# Patient Record
Sex: Male | Born: 1950 | Hispanic: Yes | Marital: Married | Smoking: Never smoker
Health system: Southern US, Community
[De-identification: ages and names within clinical notes are randomized; demographics above are authoritative.]

---

## 2019-06-18 ENCOUNTER — Inpatient Hospital Stay
Admission: EM | Admit: 2019-06-18 | Discharge: 2019-06-19 | DRG: 177 | Disposition: A | Discharge status: Other Acute Inpatient Hospital | Payer: Medicaid - Out of State | Attending: Internal Medicine | Admitting: Internal Medicine

## 2019-06-18 ENCOUNTER — Encounter: Payer: Self-pay | Admitting: Emergency Medicine

## 2019-06-18 ENCOUNTER — Emergency Department: Payer: Medicaid - Out of State

## 2019-06-18 ENCOUNTER — Other Ambulatory Visit: Payer: Self-pay

## 2019-06-18 DIAGNOSIS — U071 COVID-19: Secondary | ICD-10-CM | POA: Diagnosis not present

## 2019-06-18 DIAGNOSIS — J1289 Other viral pneumonia: Secondary | ICD-10-CM | POA: Diagnosis present

## 2019-06-18 DIAGNOSIS — M7989 Other specified soft tissue disorders: Secondary | ICD-10-CM | POA: Diagnosis present

## 2019-06-18 DIAGNOSIS — J988 Other specified respiratory disorders: Secondary | ICD-10-CM | POA: Diagnosis present

## 2019-06-18 DIAGNOSIS — E876 Hypokalemia: Secondary | ICD-10-CM | POA: Diagnosis present

## 2019-06-18 DIAGNOSIS — J9601 Acute respiratory failure with hypoxia: Secondary | ICD-10-CM | POA: Diagnosis present

## 2019-06-18 LAB — CBC WITH DIFFERENTIAL/PLATELET
Abs Immature Granulocytes: 0.04 10*3/uL (ref 0.00–0.07)
Basophils Absolute: 0 10*3/uL (ref 0.0–0.1)
Basophils Relative: 0 %
Eosinophils Absolute: 0 10*3/uL (ref 0.0–0.5)
Eosinophils Relative: 0 %
HCT: 39.1 % (ref 39.0–52.0)
Hemoglobin: 14.3 g/dL (ref 13.0–17.0)
Immature Granulocytes: 0 %
Lymphocytes Relative: 8 %
Lymphs Abs: 0.8 10*3/uL (ref 0.7–4.0)
MCH: 31.3 pg (ref 26.0–34.0)
MCHC: 36.6 g/dL — ABNORMAL HIGH (ref 30.0–36.0)
MCV: 85.6 fL (ref 80.0–100.0)
Monocytes Absolute: 0.8 10*3/uL (ref 0.1–1.0)
Monocytes Relative: 8 %
Neutro Abs: 8.2 10*3/uL — ABNORMAL HIGH (ref 1.7–7.7)
Neutrophils Relative %: 84 %
Platelets: 304 10*3/uL (ref 150–400)
RBC: 4.57 MIL/uL (ref 4.22–5.81)
RDW: 13.3 % (ref 11.5–15.5)
WBC: 9.8 10*3/uL (ref 4.0–10.5)
nRBC: 0 % (ref 0.0–0.2)

## 2019-06-18 LAB — COMPREHENSIVE METABOLIC PANEL
ALT: 31 U/L (ref 0–44)
AST: 50 U/L — ABNORMAL HIGH (ref 15–41)
Albumin: 3.8 g/dL (ref 3.5–5.0)
Alkaline Phosphatase: 78 U/L (ref 38–126)
Anion gap: 16 — ABNORMAL HIGH (ref 5–15)
BUN: 19 mg/dL (ref 8–23)
CO2: 19 mmol/L — ABNORMAL LOW (ref 22–32)
Calcium: 8.6 mg/dL — ABNORMAL LOW (ref 8.9–10.3)
Chloride: 96 mmol/L — ABNORMAL LOW (ref 98–111)
Creatinine, Ser: 0.68 mg/dL (ref 0.61–1.24)
GFR calc Af Amer: >60 mL/min (ref >60)
GFR calc non Af Amer: >60 mL/min (ref >60)
Glucose, Bld: 136 mg/dL — ABNORMAL HIGH (ref 70–99)
Potassium: 2.9 mmol/L — ABNORMAL LOW (ref 3.5–5.1)
Sodium: 131 mmol/L — ABNORMAL LOW (ref 135–145)
Total Bilirubin: 1.3 mg/dL — ABNORMAL HIGH (ref 0.3–1.2)
Total Protein: 7.8 g/dL (ref 6.5–8.1)

## 2019-06-18 LAB — LACTATE DEHYDROGENASE: LDH: 277 U/L — ABNORMAL HIGH (ref 98–192)

## 2019-06-18 LAB — POC SARS CORONAVIRUS 2 AG -  ED: SARS Coronavirus 2 Ag: POSITIVE — AB

## 2019-06-18 LAB — PROCALCITONIN: Procalcitonin: <0.1 ng/mL

## 2019-06-18 LAB — LACTIC ACID, PLASMA
Lactic Acid, Venous: 1.7 mmol/L (ref 0.5–1.9)
Lactic Acid, Venous: 1.4 mmol/L (ref 0.5–1.9)

## 2019-06-18 LAB — CBC
HCT: 38.8 % — ABNORMAL LOW (ref 39.0–52.0)
Hemoglobin: 13.6 g/dL (ref 13.0–17.0)
MCH: 31.3 pg (ref 26.0–34.0)
MCHC: 35.1 g/dL (ref 30.0–36.0)
MCV: 89.2 fL (ref 80.0–100.0)
Platelets: 285 10*3/uL (ref 150–400)
RBC: 4.35 MIL/uL (ref 4.22–5.81)
RDW: 13.2 % (ref 11.5–15.5)
WBC: 9.5 10*3/uL (ref 4.0–10.5)
nRBC: 0 % (ref 0.0–0.2)

## 2019-06-18 LAB — FIBRINOGEN: Fibrinogen: >750 mg/dL — ABNORMAL HIGH (ref 210–475)

## 2019-06-18 LAB — FIBRIN DERIVATIVES D-DIMER (ARMC ONLY): Fibrin derivatives D-dimer (ARMC): 1076.59 ng/mL (FEU) — ABNORMAL HIGH (ref 0.00–499.00)

## 2019-06-18 LAB — TRIGLYCERIDES: Triglycerides: 61 mg/dL (ref <150)

## 2019-06-18 LAB — BRAIN NATRIURETIC PEPTIDE: B Natriuretic Peptide: 30 pg/mL (ref 0.0–100.0)

## 2019-06-18 LAB — TROPONIN I (HIGH SENSITIVITY): Troponin I (High Sensitivity): 9 ng/L (ref <18)

## 2019-06-18 LAB — FERRITIN: Ferritin: 772 ng/mL — ABNORMAL HIGH (ref 24–336)

## 2019-06-18 MED ORDER — DEXAMETHASONE SODIUM PHOSPHATE 10 MG/ML IJ SOLN
6.0000 mg | Freq: Once | INTRAMUSCULAR | Status: AC
  Administered 2019-06-18: 6 mg via INTRAVENOUS
  Filled 2019-06-18: qty 1

## 2019-06-18 MED ORDER — METHYLPREDNISOLONE SODIUM SUCC 125 MG IJ SOLR
0.5000 mg/kg | Freq: Two times a day (BID) | INTRAMUSCULAR | Status: DC
  Administered 2019-06-18: 50 mg via INTRAVENOUS
  Filled 2019-06-18: qty 2

## 2019-06-18 MED ORDER — SODIUM CHLORIDE 0.9% FLUSH
3.0000 mL | Freq: Two times a day (BID) | INTRAVENOUS | Status: DC
  Administered 2019-06-18: 3 mL via INTRAVENOUS

## 2019-06-18 MED ORDER — SODIUM CHLORIDE 0.9 % IV BOLUS
500.0000 mL | Freq: Once | INTRAVENOUS | Status: AC
  Administered 2019-06-18: 500 mL via INTRAVENOUS

## 2019-06-18 MED ORDER — ACETAMINOPHEN 500 MG PO TABS
1000.0000 mg | ORAL_TABLET | Freq: Once | ORAL | Status: AC
  Administered 2019-06-18: 1000 mg via ORAL
  Filled 2019-06-18: qty 2

## 2019-06-18 MED ORDER — ADULT MULTIVITAMIN W/MINERALS CH: 1.0000 | ORAL_TABLET | Freq: Every day | ORAL | Status: DC

## 2019-06-18 MED ORDER — SODIUM CHLORIDE 0.9% FLUSH: 3.0000 mL | INTRAVENOUS | Status: DC | PRN

## 2019-06-18 MED ORDER — ENOXAPARIN SODIUM 40 MG/0.4ML ~~LOC~~ SOLN
40.0000 mg | SUBCUTANEOUS | Status: DC
  Administered 2019-06-18: 40 mg via SUBCUTANEOUS
  Filled 2019-06-18: qty 0.4

## 2019-06-18 MED ORDER — SODIUM CHLORIDE 0.9 % IV SOLN
200.0000 mg | Freq: Once | INTRAVENOUS | Status: AC
  Administered 2019-06-19: 200 mg via INTRAVENOUS
  Filled 2019-06-18: qty 200

## 2019-06-18 MED ORDER — SODIUM CHLORIDE 0.9 % IV SOLN
100.0000 mg | Freq: Every day | INTRAVENOUS | Status: DC
  Filled 2019-06-18: qty 20

## 2019-06-18 MED ORDER — POTASSIUM CHLORIDE 10 MEQ/100ML IV SOLN
10.0000 meq | INTRAVENOUS | Status: AC
  Administered 2019-06-18 – 2019-06-19 (×3): 10 meq via INTRAVENOUS
  Filled 2019-06-18 (×3): qty 100

## 2019-06-18 MED ORDER — ASCORBIC ACID 500 MG PO TABS
500.0000 mg | ORAL_TABLET | Freq: Every day | ORAL | Status: DC
  Filled 2019-06-18: qty 1

## 2019-06-18 MED ORDER — SODIUM CHLORIDE 0.9 % IV SOLN: 250.0000 mL | INTRAVENOUS | Status: DC | PRN

## 2019-06-18 MED ORDER — ALBUTEROL SULFATE HFA 108 (90 BASE) MCG/ACT IN AERS
2.0000 | INHALATION_SPRAY | Freq: Four times a day (QID) | RESPIRATORY_TRACT | Status: DC
  Administered 2019-06-18: 2 via RESPIRATORY_TRACT
  Filled 2019-06-18: qty 6.7

## 2019-06-18 MED ORDER — GUAIFENESIN-DM 100-10 MG/5ML PO SYRP
10.0000 mL | ORAL_SOLUTION | ORAL | Status: DC | PRN
  Filled 2019-06-18: qty 10

## 2019-06-18 MED ORDER — ZINC SULFATE 220 (50 ZN) MG PO CAPS
220.0000 mg | ORAL_CAPSULE | Freq: Every day | ORAL | Status: DC
  Filled 2019-06-18: qty 1

## 2019-06-18 NOTE — ED Notes (Signed)
X-ray at bedside

## 2019-06-18 NOTE — ED Triage Notes (Signed)
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 Patient has felt short of breath for apprx 5 days. 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 Reports no fever or chills, but feels as though he cannot catch his breath. Reports slight leg pain

## 2019-06-18 NOTE — H&P (Addendum)
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  Shortness of breath at rest which worsens with exertion.  Also reports right lower extremity swelling.  Arrival in the emergency room he was on O2 requiring 6 L of oxygen to maintain sats of 92%.  Temperature was 100.5 heart rate 114 and respirations 26.  Blood pressure normal at 125/79.  On his blood work he was hypokalemic with a potassium of 2.9.  Had elevations in several inflammatory biomarkers.  Chest x-ray showed no acute findings.  Covid test was positive.  Due to right lower extremity swelling a lower extremity Doppler was ordered and results are pending at the time of decision to admit.  She was started on IV Decadron and remdesivir from the emergency room.  Review of Systems: As per HPI otherwise 10 point review of systems negative.    History reviewed. No pertinent past medical history.  History reviewed. No pertinent surgical history.   reports that he has never smoked. He has never used smokeless tobacco. He reports previous alcohol use. He reports that he does not use drugs.  Not on File  History reviewed. No pertinent family history.  Prior to Admission medications   Not on File    Physical Exam: Vitals:   06/18/19 2107 06/18/19 2130 06/18/19 2200 06/18/19 2230  BP: 125/79 127/86 113/71 131/80  Pulse: (!) 114 (!) 110 (!) 104 78  Resp: (!) 22 (!) 24 (!) 29 (!) 25  Temp:      SpO2: 97% 99% 98% 95%  Weight:      Height:        Constitutional: NAD, calm, comfortable Vitals:   06/18/19 2107 06/18/19 2130 06/18/19 2200 06/18/19 2230  BP: 125/79 127/86 113/71 131/80  Pulse: (!) 114 (!) 110 (!) 104 78  Resp:  (!) 22 (!) 24 (!) 29 (!) 25  Temp:      SpO2: 97% 99% 98% 95%  Weight:      Height:       Constitutional: patient in no distress and with conversational dyspnea Eyes: PERRL, lids and conjunctivae normal ENMT: Mucous membranes are moist. Posterior pharynx clear of any exudate or lesions.Normal dentition.  Neck: normal, supple, no masses, no thyromegaly Respiratory: coarse breath sounds, tachypnea, increased accessory muscle use.  Cardiovascular: Tachycardia no murmurs / rubs / gallops. Mile RLE swelling pedal pulses. No carotid bruits.  Abdomen: no tenderness, no masses palpated. No hepatosplenomegaly. Bowel sounds positive.  Musculoskeletal: mild swelling RLESkin: no rashes, lesions, ulcers. No induration Neurologic: CN 2-12 grossly intact.  Psychiatric: A&O x3  Labs on Admission: I have personally reviewed following labs and imaging studies  CBC: Recent Labs  Lab 06/18/19 2105  WBC 9.8  NEUTROABS 8.2*  HGB 14.3  HCT 39.1  MCV 85.6  PLT 304   Basic Metabolic Panel: Recent Labs  Lab 06/18/19 2105  NA 131*  K 2.9*  CL 96*  CO2 19*  GLUCOSE 136*  BUN 19  CREATININE 0.68  CALCIUM 8.6*   GFR: Estimated Creatinine Clearance: 103.2 mL/min (by C-G formula based on SCr of 0.68 mg/dL). Liver Function Tests: Recent Labs  Lab 06/18/19 2105  AST 50*  ALT 31  ALKPHOS  78  BILITOT 1.3*  PROT 7.8  ALBUMIN 3.8   No results for input(s): LIPASE, AMYLASE in the last 168 hours. No results for input(s): AMMONIA in the last 168 hours. Coagulation Profile: No results for input(s): INR, PROTIME in the last 168 hours. Cardiac Enzymes: No results for input(s): CKTOTAL, CKMB, CKMBINDEX, TROPONINI in the last 168 hours. BNP (last 3 results) No results for input(s): PROBNP in the last 8760 hours. HbA1C: No results for input(s): HGBA1C in the last 72 hours. CBG: No results for input(s): GLUCAP in the last 168 hours. Lipid Profile: Recent Labs    06/18/19 1205  TRIG 61    Thyroid Function Tests: No results for input(s): TSH, T4TOTAL, FREET4, T3FREE, THYROIDAB in the last 72 hours. Anemia Panel: Recent Labs    06/18/19 2105  FERRITIN 772*   Urine analysis: No results found for: COLORURINE, APPEARANCEUR, LABSPEC, PHURINE, GLUCOSEU, HGBUR, BILIRUBINUR, KETONESUR, PROTEINUR, UROBILINOGEN, NITRITE, LEUKOCYTESUR  Radiological Exams on Admission: DG Chest Port 1 View  Result Date: 06/18/2019 CLINICAL DATA:  Shortness of breath EXAM: PORTABLE CHEST 1 VIEW COMPARISON:  None. FINDINGS: Low lung volumes with bibasilar atelectasis. Cardiomegaly. No overt edema or effusions. No acute bony abnormality. IMPRESSION: Low lung volumes, bibasilar atelectasis. Electronically Signed   By: Rolm Baptise M.D.   On: 06/18/2019 21:37      Assessment/Plan    Acute respiratory failure with hypoxia (HCC) secondary to COVID-19 virus infection --Patient requiring 6 L O2 to maintain sats in the low 90s --Continue supplemental oxygen to keep sats over 92% --Proning protocol as tolerated --IV remdesivir per protocol.  Vitamins --IV Solu-Medrol 0.5 mg/kg per orders    Right leg swelling --Follow-up right lower extremity Dopplers ordered by the emergency room --RLE doppler negative for DVT  Hypokalemia --Supplement and monitor   Code Status: full    Athena Masse MD [facility]   If 7PM-7AM, please contact night-coverage [email or web address] Password [email or web address]  06/18/2019, 10:41 PM  \

## 2019-06-18 NOTE — ED Provider Notes (Signed)
[facility] Emergency Department Provider Note  ____________________________________________   First MD Initiated Contact with Patient 06/18/19 2110     (approximate)  I have reviewed the triage vital signs and the nursing notes.   HISTORY  Chief Complaint Shortness of Breath    HPI William Beard is a 68 y.o. male who comes in with shortness of breath.  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  Shortness of breath is severe, constant, nothing makes it better, worse with exertion.  He also reports that he is having a little bit of swelling in his right leg.  Denies history of blood clots.  Denies any chest pain.  Denies any abdominal pain.  No known coronavirus contacts.  History reviewed. No pertinent past medical history.  There are no problems to display for this patient.   History reviewed. No pertinent surgical history.  Prior to Admission medications   Not on File    Allergies Patient has no allergy information on record.  History reviewed. No pertinent family history.  Social History Social History   Tobacco Use  . Smoking status: Never Smoker  . Smokeless tobacco: Never Used  Substance Use Topics  . Alcohol use: Not Currently  . Drug use: Never      Review of Systems Constitutional: No fever/chills Eyes: No visual changes. ENT: No sore throat. Cardiovascular: No chest pain Respiratory: Positive for SOB Gastrointestinal: No abdominal pain.  No nausea, no vomiting.  No diarrhea.  No constipation. Genitourinary: Negative for dysuria. Musculoskeletal: Negative for back pain.  Leg swelling Skin: Negative for rash. Neurological: Negative for headaches, focal weakness or numbness. All other ROS negative ____________________________________________   PHYSICAL EXAM:  VITAL SIGNS: ED Triage Vitals  Enc Vitals Group     BP --      Pulse Rate 06/18/19 2103 (!) 114     Resp  06/18/19 2103 (!) 26     Temp 06/18/19 2103 (!) 100.5 F (38.1 C)     Temp src --      SpO2 06/18/19 2103 98 %     Weight 06/18/19 2104 220 lb (99.8 kg)     Height 06/18/19 2104 5\' 10"  (1.778 m)     Head Circumference --      Peak Flow --      Pain Score 06/18/19 2103 2     Pain Loc --      Pain Edu? --      Excl. in [place]? --     Constitutional: Alert and oriented. Well appearing and in no acute distress. Eyes: Conjunctivae are normal. EOMI. Head: Atraumatic. Nose: No congestion/rhinnorhea. Mouth/Throat: Mucous membranes are moist.   Neck: No stridor. Trachea Midline. FROM Cardiovascular: Tachycardic, regular rhythm. Grossly normal heart sounds.  Good peripheral circulation. Respiratory: Increased work of breathing on 6 L no stridor Gastrointestinal: Soft and nontender. No distention. No abdominal bruits.  Musculoskeletal: May be slightly enlarged right leg.  Left leg.  No joint effusions. Neurologic:  Normal speech and language. No gross focal neurologic deficits are appreciated.  Skin:  Skin is warm, dry and intact. No rash noted. Psychiatric: Mood and affect are normal. Speech and behavior are normal. GU: Deferred   ____________________________________________   LABS (all labs ordered are listed, but only abnormal results are displayed)  Labs Reviewed  CBC WITH DIFFERENTIAL/PLATELET - Abnormal; Notable for the following components:      Result Value   MCHC  36.6 (*)    Neutro Abs 8.2 (*)    All other components within normal limits  COMPREHENSIVE METABOLIC PANEL - Abnormal; Notable for the following components:   Sodium 131 (*)    Potassium 2.9 (*)    Chloride 96 (*)    CO2 19 (*)    Glucose, Bld 136 (*)    Calcium 8.6 (*)    AST 50 (*)    Total Bilirubin 1.3 (*)    Anion gap 16 (*)    All other components within normal limits  FIBRIN DERIVATIVES D-DIMER ([facility] ONLY) - Abnormal; Notable for the following components:   Fibrin derivatives D-dimer ([facility]) 1,076.59 (*)     All other components within normal limits  LACTATE DEHYDROGENASE - Abnormal; Notable for the following components:   LDH 277 (*)    All other components within normal limits  FERRITIN - Abnormal; Notable for the following components:   Ferritin 772 (*)    All other components within normal limits  FIBRINOGEN - Abnormal; Notable for the following components:   Fibrinogen >750 (*)    All other components within normal limits  POC SARS CORONAVIRUS 2 AG -  ED - Abnormal; Notable for the following components:   SARS Coronavirus 2 Ag Positive (*)    All other components within normal limits  CULTURE, BLOOD (ROUTINE X 2)  CULTURE, BLOOD (ROUTINE X 2)  LACTIC ACID, PLASMA  BRAIN NATRIURETIC PEPTIDE  LACTIC ACID, PLASMA  PROCALCITONIN  TRIGLYCERIDES  C-REACTIVE PROTEIN  MAGNESIUM  TROPONIN I (HIGH SENSITIVITY)   ____________________________________________   ED ECG REPORT I, Concha Se, the attending physician, personally viewed and interpreted this ECG.  EKG is sinus rate of 90, no ST elevations, no T wave inversions, normal intervals ____________________________________________  RADIOLOGY Vela Prose, personally viewed and evaluated these images (plain radiographs) as part of my medical decision making, as well as reviewing the written report by the radiologist.  ED MD interpretation:  No PNA  Official radiology report(s): DG Chest Port 1 View  Result Date: 06/18/2019 CLINICAL DATA:  Shortness of breath EXAM: PORTABLE CHEST 1 VIEW COMPARISON:  None. FINDINGS: Low lung volumes with bibasilar atelectasis. Cardiomegaly. No overt edema or effusions. No acute bony abnormality. IMPRESSION: Low lung volumes, bibasilar atelectasis. Electronically Signed   By: Charlett Nose M.D.   On: 06/18/2019 21:37    ____________________________________________   PROCEDURES  Procedure(s) performed (including Critical Care):  .Critical Care Performed by: Concha Se, MD Authorized  by: Concha Se, MD   Critical care provider statement:    Critical care time (minutes):  35   Critical care was necessary to treat or prevent imminent or life-threatening deterioration of the following conditions:  Respiratory failure   Critical care was time spent personally by me on the following activities:  Discussions with consultants, evaluation of patient's response to treatment, examination of patient, ordering and performing treatments and interventions, ordering and review of laboratory studies, ordering and review of radiographic studies, pulse oximetry, re-evaluation of patient's condition, obtaining history from patient or surrogate and review of old charts     ____________________________________________   INITIAL IMPRESSION / ASSESSMENT AND PLAN / ED COURSE   Zaydin Billey was evaluated in Emergency Department on 06/18/2019 for the symptoms described in the history of present illness. He was evaluated in the context of the global COVID-19 pandemic, which necessitated consideration that the patient might be at risk for infection with the SARS-CoV-2 virus that causes COVID-19.  Institutional protocols and algorithms that pertain to the evaluation of patients at risk for COVID-19 are in a state of rapid change based on information released by regulatory bodies including the CDC and federal and state organizations. These policies and algorithms were followed during the patient's care in the ED.     Pt presents with SOB. Most likely from COVID+ but no prior diagnosis. Will get testing.   PNA-will get xray to evaluation Anemia-CBC to evaluate ACS- will get trops Arrhythmia-Will get EKG and keep on monitor.  COVID- will get testing per algorithm. 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    Pt is COVID +  Patient's potassium slightly low so we will give some K back.  Also given some fluids for some signs of will dehydration.  Will discuss  with the hospital team for admission.  We will give steroids, Tylenol.  Holding off on antibiotics at this time given white count is normal pending procalcitonin   ____________________________________________   FINAL CLINICAL IMPRESSION(S) / ED DIAGNOSES   Final diagnoses:  COVID-19  Acute respiratory failure with hypoxia (HCC)  Hypokalemia     MEDICATIONS GIVEN DURING THIS VISIT:  Medications  dexamethasone (DECADRON) injection 6 mg (has no administration in time range)  sodium chloride 0.9 % bolus 500 mL (has no administration in time range)  potassium chloride 10 mEq in 100 mL IVPB (has no administration in time range)  acetaminophen (TYLENOL) tablet 1,000 mg (1,000 mg Oral Given 06/18/19 2118)     ED Discharge Orders    None       Note:  This document was prepared using Dragon voice recognition software and may include unintentional dictation errors.   Concha SeFunke, Daanish Copes E, MD 06/18/19 2226

## 2019-06-18 NOTE — ED Notes (Signed)
Ultrasound at bedside

## 2019-06-19 ENCOUNTER — Inpatient Hospital Stay: Payer: Medicaid - Out of State

## 2019-06-19 ENCOUNTER — Inpatient Hospital Stay (HOSPITAL_COMMUNITY)
Admission: EM | Admit: 2019-06-19 | Discharge: 2019-07-06 | DRG: 177 | Disposition: A | Discharge status: Home / Self Care | Payer: Medicaid - Out of State | Source: Other Acute Inpatient Hospital | Attending: Family Medicine | Admitting: Family Medicine

## 2019-06-19 ENCOUNTER — Encounter (HOSPITAL_COMMUNITY): Payer: Self-pay | Admitting: Family Medicine

## 2019-06-19 DIAGNOSIS — E876 Hypokalemia: Secondary | ICD-10-CM | POA: Diagnosis not present

## 2019-06-19 DIAGNOSIS — J9601 Acute respiratory failure with hypoxia: Secondary | ICD-10-CM | POA: Diagnosis present

## 2019-06-19 DIAGNOSIS — J1282 Pneumonia due to coronavirus disease 2019: Secondary | ICD-10-CM | POA: Diagnosis present

## 2019-06-19 DIAGNOSIS — I4891 Unspecified atrial fibrillation: Secondary | ICD-10-CM | POA: Diagnosis present

## 2019-06-19 DIAGNOSIS — U071 COVID-19: Secondary | ICD-10-CM | POA: Diagnosis present

## 2019-06-19 DIAGNOSIS — I4892 Unspecified atrial flutter: Secondary | ICD-10-CM | POA: Diagnosis present

## 2019-06-19 DIAGNOSIS — R0602 Shortness of breath: Secondary | ICD-10-CM

## 2019-06-19 DIAGNOSIS — E669 Obesity, unspecified: Secondary | ICD-10-CM | POA: Diagnosis present

## 2019-06-19 DIAGNOSIS — Z6831 Body mass index (BMI) 31.0-31.9, adult: Secondary | ICD-10-CM | POA: Diagnosis not present

## 2019-06-19 LAB — COMPREHENSIVE METABOLIC PANEL
ALT: 29 U/L (ref 0–44)
AST: 47 U/L — ABNORMAL HIGH (ref 15–41)
Albumin: 3.5 g/dL (ref 3.5–5.0)
Alkaline Phosphatase: 73 U/L (ref 38–126)
Anion gap: 13 (ref 5–15)
BUN: 19 mg/dL (ref 8–23)
CO2: 24 mmol/L (ref 22–32)
Calcium: 8.3 mg/dL — ABNORMAL LOW (ref 8.9–10.3)
Chloride: 98 mmol/L (ref 98–111)
Creatinine, Ser: 0.77 mg/dL (ref 0.61–1.24)
GFR calc Af Amer: >60 mL/min (ref >60)
GFR calc non Af Amer: >60 mL/min (ref >60)
Glucose, Bld: 150 mg/dL — ABNORMAL HIGH (ref 70–99)
Potassium: 3.6 mmol/L (ref 3.5–5.1)
Sodium: 135 mmol/L (ref 135–145)
Total Bilirubin: 1.2 mg/dL (ref 0.3–1.2)
Total Protein: 7.4 g/dL (ref 6.5–8.1)

## 2019-06-19 LAB — HIV ANTIBODY (ROUTINE TESTING W REFLEX): HIV Screen 4th Generation wRfx: NONREACTIVE

## 2019-06-19 LAB — CBC WITH DIFFERENTIAL/PLATELET
Abs Immature Granulocytes: 0.03 10*3/uL (ref 0.00–0.07)
Basophils Absolute: 0 10*3/uL (ref 0.0–0.1)
Basophils Relative: 0 %
Eosinophils Absolute: 0 10*3/uL (ref 0.0–0.5)
Eosinophils Relative: 0 %
HCT: 40.6 % (ref 39.0–52.0)
Hemoglobin: 13.9 g/dL (ref 13.0–17.0)
Immature Granulocytes: 0 %
Lymphocytes Relative: 10 %
Lymphs Abs: 0.8 10*3/uL (ref 0.7–4.0)
MCH: 31 pg (ref 26.0–34.0)
MCHC: 34.2 g/dL (ref 30.0–36.0)
MCV: 90.6 fL (ref 80.0–100.0)
Monocytes Absolute: 0.3 10*3/uL (ref 0.1–1.0)
Monocytes Relative: 3 %
Neutro Abs: 6.8 10*3/uL (ref 1.7–7.7)
Neutrophils Relative %: 87 %
Platelets: 270 10*3/uL (ref 150–400)
RBC: 4.48 MIL/uL (ref 4.22–5.81)
RDW: 13.2 % (ref 11.5–15.5)
WBC: 7.9 10*3/uL (ref 4.0–10.5)
nRBC: 0 % (ref 0.0–0.2)

## 2019-06-19 LAB — ABO/RH
ABO/RH(D): O POS
ABO/RH(D): O POS

## 2019-06-19 LAB — PHOSPHORUS: Phosphorus: 3.4 mg/dL (ref 2.5–4.6)

## 2019-06-19 LAB — MAGNESIUM
Magnesium: 2.3 mg/dL (ref 1.7–2.4)
Magnesium: 2.6 mg/dL — ABNORMAL HIGH (ref 1.7–2.4)

## 2019-06-19 LAB — C-REACTIVE PROTEIN
CRP: 11.5 mg/dL — ABNORMAL HIGH (ref <1.0)
CRP: 11.9 mg/dL — ABNORMAL HIGH (ref <1.0)

## 2019-06-19 LAB — FERRITIN: Ferritin: 800 ng/mL — ABNORMAL HIGH (ref 24–336)

## 2019-06-19 LAB — FIBRIN DERIVATIVES D-DIMER (ARMC ONLY): Fibrin derivatives D-dimer (ARMC): 840.79 ng/mL (FEU) — ABNORMAL HIGH (ref 0.00–499.00)

## 2019-06-19 LAB — GLUCOSE, CAPILLARY
Glucose-Capillary: 133 mg/dL — ABNORMAL HIGH (ref 70–99)
Glucose-Capillary: 161 mg/dL — ABNORMAL HIGH (ref 70–99)
Glucose-Capillary: 208 mg/dL — ABNORMAL HIGH (ref 70–99)

## 2019-06-19 LAB — HEMOGLOBIN A1C
Hgb A1c MFr Bld: 6.4 % — ABNORMAL HIGH (ref 4.8–5.6)
Mean Plasma Glucose: 136.98 mg/dL

## 2019-06-19 LAB — TROPONIN I (HIGH SENSITIVITY): Troponin I (High Sensitivity): 12 ng/L (ref <18)

## 2019-06-19 MED ORDER — SODIUM CHLORIDE 0.9 % IV SOLN: 100.0000 mg | Freq: Every day | INTRAVENOUS | Status: DC

## 2019-06-19 MED ORDER — ONDANSETRON HCL 4 MG PO TABS: 4.0000 mg | ORAL_TABLET | Freq: Four times a day (QID) | ORAL | Status: DC | PRN

## 2019-06-19 MED ORDER — BISACODYL 5 MG PO TBEC
5.0000 mg | DELAYED_RELEASE_TABLET | Freq: Every day | ORAL | Status: DC | PRN
  Administered 2019-06-26 – 2019-07-02 (×4): 5 mg via ORAL
  Filled 2019-06-19 (×5): qty 1

## 2019-06-19 MED ORDER — ALBUTEROL SULFATE HFA 108 (90 BASE) MCG/ACT IN AERS
2.0000 | INHALATION_SPRAY | Freq: Four times a day (QID) | RESPIRATORY_TRACT | Status: DC | PRN
  Filled 2019-06-19: qty 6.7

## 2019-06-19 MED ORDER — SODIUM CHLORIDE 0.9 % IV SOLN
100.0000 mg | Freq: Every day | INTRAVENOUS | Status: AC
  Administered 2019-06-19 – 2019-06-22 (×4): 100 mg via INTRAVENOUS
  Filled 2019-06-19 (×3): qty 20

## 2019-06-19 MED ORDER — ONDANSETRON HCL 4 MG/2ML IJ SOLN: 4.0000 mg | Freq: Four times a day (QID) | INTRAMUSCULAR | Status: DC | PRN

## 2019-06-19 MED ORDER — ENOXAPARIN SODIUM 40 MG/0.4ML ~~LOC~~ SOLN: 40.0000 mg | Freq: Two times a day (BID) | SUBCUTANEOUS | Status: DC

## 2019-06-19 MED ORDER — INSULIN ASPART 100 UNIT/ML ~~LOC~~ SOLN
0.0000 [IU] | Freq: Three times a day (TID) | SUBCUTANEOUS | Status: DC
  Administered 2019-06-19: 1 [IU] via SUBCUTANEOUS
  Administered 2019-06-19 – 2019-06-20 (×2): 2 [IU] via SUBCUTANEOUS
  Administered 2019-06-20: 1 [IU] via SUBCUTANEOUS
  Administered 2019-06-20: 2 [IU] via SUBCUTANEOUS
  Administered 2019-06-21 (×2): 1 [IU] via SUBCUTANEOUS
  Administered 2019-06-21 – 2019-06-22 (×2): 2 [IU] via SUBCUTANEOUS
  Administered 2019-06-22: 3 [IU] via SUBCUTANEOUS
  Administered 2019-06-22: 5 [IU] via SUBCUTANEOUS
  Administered 2019-06-23 (×2): 3 [IU] via SUBCUTANEOUS
  Administered 2019-06-23 – 2019-06-24 (×3): 2 [IU] via SUBCUTANEOUS
  Administered 2019-06-24: 5 [IU] via SUBCUTANEOUS
  Administered 2019-06-25: 7 [IU] via SUBCUTANEOUS
  Administered 2019-06-25: 2 [IU] via SUBCUTANEOUS
  Administered 2019-06-25: 3 [IU] via SUBCUTANEOUS
  Administered 2019-06-26: 2 [IU] via SUBCUTANEOUS
  Administered 2019-06-26 (×2): 5 [IU] via SUBCUTANEOUS
  Administered 2019-06-27 (×2): 3 [IU] via SUBCUTANEOUS
  Administered 2019-06-27: 1 [IU] via SUBCUTANEOUS
  Administered 2019-06-28: 7 [IU] via SUBCUTANEOUS
  Administered 2019-06-28: 3 [IU] via SUBCUTANEOUS
  Administered 2019-06-28: 1 [IU] via SUBCUTANEOUS
  Administered 2019-06-29: 2 [IU] via SUBCUTANEOUS
  Administered 2019-06-29: 1 [IU] via SUBCUTANEOUS
  Administered 2019-06-29: 2 [IU] via SUBCUTANEOUS
  Administered 2019-06-30: 1 [IU] via SUBCUTANEOUS
  Administered 2019-06-30: 2 [IU] via SUBCUTANEOUS
  Administered 2019-06-30: 1 [IU] via SUBCUTANEOUS
  Administered 2019-07-01 (×2): 2 [IU] via SUBCUTANEOUS
  Administered 2019-07-02: 1 [IU] via SUBCUTANEOUS
  Administered 2019-07-02: 2 [IU] via SUBCUTANEOUS
  Administered 2019-07-03 (×2): 1 [IU] via SUBCUTANEOUS
  Administered 2019-07-03 – 2019-07-04 (×2): 2 [IU] via SUBCUTANEOUS
  Administered 2019-07-04 – 2019-07-05 (×2): 1 [IU] via SUBCUTANEOUS

## 2019-06-19 MED ORDER — ACETAMINOPHEN 325 MG PO TABS: 650.0000 mg | ORAL_TABLET | Freq: Four times a day (QID) | ORAL | Status: DC | PRN

## 2019-06-19 MED ORDER — ASPIRIN 81 MG PO CHEW
81.0000 mg | CHEWABLE_TABLET | Freq: Every day | ORAL | Status: DC
  Administered 2019-06-19 – 2019-07-06 (×18): 81 mg via ORAL
  Filled 2019-06-19 (×18): qty 1

## 2019-06-19 MED ORDER — IOHEXOL 350 MG/ML SOLN
75.0000 mL | Freq: Once | INTRAVENOUS | Status: AC | PRN
  Administered 2019-06-19: 05:00:00 75 mL via INTRAVENOUS

## 2019-06-19 MED ORDER — METHYLPREDNISOLONE SODIUM SUCC 125 MG IJ SOLR
60.0000 mg | Freq: Two times a day (BID) | INTRAMUSCULAR | Status: DC
  Administered 2019-06-19 – 2019-06-22 (×7): 60 mg via INTRAVENOUS
  Filled 2019-06-19 (×8): qty 2

## 2019-06-19 MED ORDER — INSULIN ASPART 100 UNIT/ML ~~LOC~~ SOLN
0.0000 [IU] | Freq: Every day | SUBCUTANEOUS | Status: DC
  Administered 2019-06-19 – 2019-06-26 (×3): 2 [IU] via SUBCUTANEOUS

## 2019-06-19 MED ORDER — GUAIFENESIN-DM 100-10 MG/5ML PO SYRP
10.0000 mL | ORAL_SOLUTION | ORAL | Status: DC | PRN
  Administered 2019-06-21 – 2019-06-26 (×4): 10 mL via ORAL
  Filled 2019-06-19 (×4): qty 10

## 2019-06-19 MED ORDER — SODIUM CHLORIDE 0.9 % IV SOLN: 200.0000 mg | Freq: Once | INTRAVENOUS | Status: DC

## 2019-06-19 MED ORDER — ENOXAPARIN SODIUM 40 MG/0.4ML ~~LOC~~ SOLN
40.0000 mg | SUBCUTANEOUS | Status: DC
  Administered 2019-06-19 – 2019-07-05 (×17): 40 mg via SUBCUTANEOUS
  Filled 2019-06-19 (×17): qty 0.4

## 2019-06-19 MED ORDER — HYDROCOD POLST-CPM POLST ER 10-8 MG/5ML PO SUER: 5.0000 mL | Freq: Two times a day (BID) | ORAL | Status: DC | PRN

## 2019-06-19 MED ORDER — METOPROLOL TARTRATE 25 MG PO TABS
25.0000 mg | ORAL_TABLET | Freq: Two times a day (BID) | ORAL | Status: DC
  Administered 2019-06-19 – 2019-07-03 (×24): 25 mg via ORAL
  Filled 2019-06-19 (×30): qty 1

## 2019-06-19 NOTE — ED Notes (Addendum)
This RN to bedside at this time with video interpreter to discuss impending transfer to [facility]. Pt states he did not know he was going to [facility] and he doesn't know why he can't go home. This RN explained that he was on high levels of oxygen and needed to be admitted. Pt states he has 2 sons at home who were covid positive and that they got to home, this RN explained that they probably did not need 5L of oxygen. This RN messaged admitting MD regarding concerns of patient not being told he was being transferred to [facility] and pt questioning going home and needing to be in [place] in 3 days.

## 2019-06-19 NOTE — ED Notes (Signed)
Per Admitting MD pt agreeable to transfer to [facility] at this time.

## 2019-06-19 NOTE — H&P (Signed)
TRH H&P   Patient Demographics:    William Beard, is a 68 y.o. male  MRN: [id number]   DOB - August 31, 1950  Admit Date - 06/19/2019  Outpatient Primary MD for the patient is Patient, No Pcp Per    Patient coming from: [facility]  CC - SOB    HPI:    William Beard  is a 68 y.o. 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  Presented to [facility] ER where he was found to have acute hypoxic respiratory failure due to COVID-19 pneumonia.  He was placed on 5 L nasal cannula oxygen and sent to [place].  Currently besides cough and exertional shortness of breath patient is symptom-free, denies any headache, no fever chills at this time, no chest or abdominal pain.  No diarrhea or dysuria.  No focal weakness.  Interview was conducted with the help of online interpreter.   Review of systems:    A full 10 point Review of Systems was done, except as stated above, all other Review of Systems were negative.   With Past History of the following :    No known past medical or surgical problems.   Social History:     Social History   Tobacco Use  . Smoking status: Never Smoker  . Smokeless tobacco: Never Used  Substance Use Topics  . Alcohol use: Not Currently      Family History :   No DM type II in his immediate family, wife is diabetic.   Home Medications:   No home medications  Allergies:    No Known Allergies   Physical Exam:   Vitals  Blood pressure 125/84, pulse (!) 101, temperature (P) 98.1 F (36.7 C),  temperature source (P) Oral, resp. rate (!) 23, SpO2 92 %.   1. General middle-aged Hispanic male lying in hospital bed in no apparent discomfort wearing oxygen,  2. Normal affect and insight, Not Suicidal or Homicidal, Awake Alert,    3. No F.N deficits, ALL C.Nerves Intact, Strength 5/5 all 4 extremities, Sensation intact all 4 extremities, Plantars down going.  4. Ears and Eyes appear Normal, Conjunctivae clear,  PERRLA. Moist Oral Mucosa.  5. Supple Neck, No JVD, No cervical lymphadenopathy appriciated, No Carotid Bruits.  6. Symmetrical Chest wall movement, Good air movement bilaterally, CTAB.  7. RRR, No Gallops, Rubs or Murmurs, No Parasternal Heave.  8. Positive Bowel Sounds, Abdomen Soft, No tenderness, No organomegaly appriciated,No rebound -guarding or rigidity.  9.  No Cyanosis, Normal Skin Turgor, No Skin Rash or Bruise.  10. Good muscle tone,  joints appear normal , no effusions, Normal ROM.  11. No Palpable Lymph Nodes in Neck or Axillae      Data Review:    CBC Recent Labs  Lab 06/18/19 2105 06/18/19 2259 06/19/19 0433  WBC 9.8 9.5 7.9  HGB 14.3 13.6 13.9  HCT 39.1 38.8* 40.6  PLT 304 285 270  MCV 85.6 89.2 90.6  MCH 31.3 31.3 31.0  MCHC 36.6* 35.1 34.2  RDW 13.3 13.2 13.2  LYMPHSABS 0.8  --  0.8  MONOABS 0.8  --  0.3  EOSABS 0.0  --  0.0  BASOSABS 0.0  --  0.0   ------------------------------------------------------------------------------------------------------------------  Chemistries  Recent Labs  Lab 06/18/19 2105 06/18/19 2259 06/19/19 0522  NA 131*  --  135  K 2.9*  --  3.6  CL 96*  --  98  CO2 19*  --  24  GLUCOSE 136*  --  150*  BUN 19  --  19  CREATININE 0.68  --  0.77  CALCIUM 8.6*  --  8.3*  MG  --  2.3 2.6*  AST 50*  --  47*  ALT 31  --  29  ALKPHOS 78  --  73  BILITOT 1.3*  --  1.2   ------------------------------------------------------------------------------------------------------------------ estimated creatinine  clearance is 103.2 mL/min (by C-G formula based on SCr of 0.77 mg/dL). ------------------------------------------------------------------------------------------------------------------ No results for input(s): TSH, T4TOTAL, T3FREE, THYROIDAB in the last 72 hours.  Invalid input(s): FREET3  Coagulation profile No results for input(s): INR, PROTIME in the last 168 hours. ------------------------------------------------------------------------------------------------------------------- No results for input(s): DDIMER in the last 72 hours. -------------------------------------------------------------------------------------------------------------------  Cardiac Enzymes No results for input(s): CKMB, TROPONINI, MYOGLOBIN in the last 168 hours.  Invalid input(s): CK ------------------------------------------------------------------------------------------------------------------    Component Value Date/Time   BNP 30.0 06/18/2019 2105     ---------------------------------------------------------------------------------------------------------------  Urinalysis No results found for: COLORURINE, APPEARANCEUR, LABSPEC, PHURINE, GLUCOSEU, HGBUR, BILIRUBINUR, KETONESUR, PROTEINUR, UROBILINOGEN, NITRITE, LEUKOCYTESUR  ----------------------------------------------------------------------------------------------------------------   Imaging Results:    CT ANGIO CHEST PE W OR WO CONTRAST  Result Date: 06/19/2019 CLINICAL DATA:  Shortness of breath. Weakness. EXAM: CT ANGIOGRAPHY CHEST WITH CONTRAST TECHNIQUE: Multidetector CT imaging of the chest was performed using the standard protocol during bolus administration of intravenous contrast. Multiplanar CT image reconstructions and MIPs were obtained to evaluate the vascular anatomy. CONTRAST:  25m OMNIPAQUE IOHEXOL 350 MG/ML SOLN COMPARISON:  Radiograph yesterday. FINDINGS: Cardiovascular: There are no filling defects within the pulmonary  arteries to suggest pulmonary embolus. Mild aortic atherosclerosis. No aortic dissection. Mild multi chamber cardiomegaly. No pericardial effusion. Mediastinum/Nodes: No enlarged mediastinal or hilar lymph nodes. Small upper paratracheal nodes are subcentimeter. Decompressed esophagus. No thyroid nodule. Lungs/Pleura: Bilateral heterogeneous primarily ground-glass opacities throughout both lungs, peripheral and slight basilar predominant distribution. No pleural fluid. Trachea and mainstem bronchi are patent. Upper Abdomen: No acute findings. Musculoskeletal: There are no acute or suspicious osseous abnormalities. Mild scoliosis and degenerative change in the spine. Review of the MIP images confirms the above findings. IMPRESSION: 1. No pulmonary embolus. 2. Bilateral heterogeneous primarily ground-glass opacities throughout both  lungs, consistent with COVID-19 pneumonia. 3. Mild cardiomegaly. Aortic Atherosclerosis (ICD10-I70.0). Electronically Signed   By: Keith Rake M.D.   On: 06/19/2019 05:08   US Venous Img Lower Unilateral Right  Result Date: 06/18/2019 CLINICAL DATA:  Pain and swelling EXAM: RIGHT LOWER EXTREMITY VENOUS DOPPLER ULTRASOUND TECHNIQUE: Gray-scale sonography with graded compression, as well as color Doppler and duplex ultrasound were performed to evaluate the lower extremity deep venous systems from the level of the common femoral vein and including the common femoral, femoral, profunda femoral, popliteal and calf veins including the posterior tibial, peroneal and gastrocnemius veins when visible. The superficial great saphenous vein was also interrogated. Spectral Doppler was utilized to evaluate flow at rest and with distal augmentation maneuvers in the common femoral, femoral and popliteal veins. COMPARISON:  None. FINDINGS: Contralateral Common Femoral Vein: Respiratory phasicity is normal and symmetric with the symptomatic side. No evidence of thrombus. Normal compressibility.  Common Femoral Vein: No evidence of thrombus. Normal compressibility, respiratory phasicity and response to augmentation. Saphenofemoral Junction: No evidence of thrombus. Normal compressibility and flow on color Doppler imaging. Profunda Femoral Vein: No evidence of thrombus. Normal compressibility and flow on color Doppler imaging. Femoral Vein: No evidence of thrombus. Normal compressibility, respiratory phasicity and response to augmentation. Popliteal Vein: No evidence of thrombus. Normal compressibility, respiratory phasicity and response to augmentation. Calf Veins: No evidence of thrombus. Normal compressibility and flow on color Doppler imaging. Superficial Great Saphenous Vein: No evidence of thrombus. Normal compressibility. Venous Reflux:  None. Other Findings:  None. IMPRESSION: No evidence of deep venous thrombosis. Electronically Signed   By: Constance Holster M.D.   On: 06/18/2019 23:10   DG Chest Port 1 View  Result Date: 06/18/2019 CLINICAL DATA:  Shortness of breath EXAM: PORTABLE CHEST 1 VIEW COMPARISON:  None. FINDINGS: Low lung volumes with bibasilar atelectasis. Cardiomegaly. No overt edema or effusions. No acute bony abnormality. IMPRESSION: Low lung volumes, bibasilar atelectasis. Electronically Signed   By: Rolm Baptise M.D.   On: 06/18/2019 21:37    My personal review of EKG: Rhythm questionable atrial fibrillation, this was done at [facility] ER, rate in low 100s,   Assessment & Plan:      1.  Acute hypoxic respiratory failure due to acute COVID-19 pneumonitis.  He has moderate disease, he has been started on IV steroids and remdesivir, he says he feels better currently, he will be monitored closely, he has consented preliminary for Actemra use if needed.  If he develops more hypoxia Actemra will be used.  Encouraged him to sit up in chair in the daytime and use I-S and flutter valve for pulmonary toiletry.  Prone in bed at night.  COVID-19 Labs  Recent Labs     06/18/19 2105 06/18/19 2128 06/19/19 0433 06/19/19 0522  FERRITIN 772*  --   --  800*  LDH 277*  --   --   --   CRP  --  11.5* 11.9*  --     No results found for: SARSCOV2NAA  Hepatic Function Latest Ref Rng & Units 06/19/2019 06/18/2019  Total Protein 6.5 - 8.1 g/dL 7.4 7.8  Albumin 3.5 - 5.0 g/dL 3.5 3.8  AST 15 - 41 U/L 47(H) 50(H)  ALT 0 - 44 U/L 29 31  Alk Phosphatase 38 - 126 U/L 73 78  Total Bilirubin 0.3 - 1.2 mg/dL 1.2 1.3(H)     2.  Possible newly diagnosed A. fib.  Repeat EKG, [place] vas 2 score will be 1 for  age.  Aspirin placed, will repeat EKG, check TSH and echo.  Low-dose beta-blocker and aspirin for now and monitor.    DVT Prophylaxis   Lovenox    AM Labs Ordered, also please review Full Orders  Family Communication: Admission, patients condition and plan of care including tests being ordered have been discussed with the patient  who indicates understanding and agree with the plan and Code Status.  Code Status Full  Likely DC to  TBD  Condition Fair  Consults called: None    Admission status: inpt    Time spent in minutes : 35   Lala Lund M.D on 06/19/2019 at 11:56 AM  To page go to [email or web address] - password [facility]

## 2019-06-19 NOTE — ED Notes (Signed)
This RN to bedside, pt noted to be resting in bed, remains on 5L via [place] humidified at this time. Pt's HR remains tachy and irregular.Lights remain dimmed for patient comfort. Pt denies any pain. Will continue to monitor for further patient needs.

## 2019-06-19 NOTE — ED Notes (Signed)
Pt requesting to leave keys to his truck for someone to pick up, pt calling dispatch to get a name of driver who will pick up due to this RN being unable to accept keys without known accept

## 2019-06-19 NOTE — Progress Notes (Signed)
Unable to update family due to no family contact information in demographics.  Patient asked if there was anyone I could place on his chart for updates and/or emergencies and he declined saying, "No it is my business only."  Patients wishes to not have someone on record honored.

## 2019-06-19 NOTE — Plan of Care (Signed)
Care Plan established

## 2019-06-19 NOTE — Discharge Summary (Signed)
Physician Discharge Summary  Patient ID: William Beard MRN: [id number] DOB/AGE: March 23, 1951 68 y.o.  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  Shortness of breath at rest which worsens with exertion.  Also reports right lower extremity swelling.  Arrival in the emergency room he was on O2 requiring 6 L of oxygen to maintain sats of 92%.  Temperature was 100.5 heart rate 114 and respirations 26.  Blood pressure normal at 125/79.  On his blood work he was hypokalemic with a potassium of 2.9.  Had elevations in several inflammatory biomarkers.  Chest x-ray showed no acute findings.  Covid test was positive.  Due to right lower extremity swelling a lower extremity Doppler was ordered and results are pending at the time of decision to admit.  He was started on IV Decadron and remdesivir from the emergency room. Due to increased O2 requirement, patient is being transferred to [facility] for further care  Consults: None  Significant Diagnostic Studies: radiology: CT scan: chest  Treatments: remdesivir, steroids, oxygen and lovenox  Discharge Exam: Blood pressure 112/71, pulse 98, temperature (!) 97 F (36.1 C), temperature source Axillary, resp. rate 17, height 5\' 10"  (1.778 m), weight 99.8 kg, SpO2 98 %. General appearance: alert and moderate distress Resp: diminished breath sounds bilaterally, crackles Cardio: regular rate and  rhythm Extremities: extremities normal, atraumatic, no cyanosis or edema Neurologic: Grossly normal  Disposition: Discharge disposition: [place] Not Defined       Discharge Instructions    Diet - low sodium heart healthy   Complete by: As directed    Increase activity slowly   Complete by: As directed      Allergies as of 06/19/2019   Not on File           Signed: Athena Masse 06/19/2019, 5:34 AM

## 2019-06-19 NOTE — ED Notes (Signed)
Patient transported to CT 

## 2019-06-19 NOTE — Progress Notes (Signed)
Remdesivir - Pharmacy Brief Note   O:  ALT: 31 CXR: IMPRESSION: Low lung volumes, bibasilar atelectasis. SpO2: 99% on 5L HFNC   A/P:  Remdesivir 200 mg IVPB once followed by 100 mg IVPB daily x 4 days.   Tobie Lords, PharmD, BCPS Clinical Pharmacist 06/19/2019 12:29 AM

## 2019-06-19 NOTE — ED Notes (Signed)
Patient resting.

## 2019-06-19 NOTE — ED Notes (Signed)
AM labs drawn, patient calm and comfortable. No needs expressed at this time. Patient is currently in the process of being transferred to [facility]

## 2019-06-19 NOTE — ED Notes (Signed)
Patient asleep, no complaints at present. Call bell within reach. NAD, vital signs stable. 

## 2019-06-19 NOTE — ED Notes (Addendum)
Informed NP that patient's EKG is reading afib and new EKG was saved by this RN

## 2019-06-19 NOTE — ED Notes (Signed)
This RN discussed with Charge RN pt's request with keys. Per Agricultural consultant have patient call dispatch and notify them that whoever is picking up keys must be able to provide patient's name to pick up keys so there is no further delay of transfer. Pt's keys placed in patient belonging bag with patient label on bag.

## 2019-06-19 NOTE — ED Notes (Signed)
Admitting MD at bedside to speak with patient regarding transfer to [facility].

## 2019-06-19 NOTE — ED Notes (Signed)
No new orders at this time, NP aware of HR

## 2019-06-19 NOTE — ED Notes (Addendum)
Patient gives this RN and charge RN verbal consent to transfer to [facility] for further COVID treatment

## 2019-06-19 NOTE — ED Notes (Addendum)
Pt using urinal, HR tachy to 130s, NP aware

## 2019-06-20 LAB — COMPREHENSIVE METABOLIC PANEL
ALT: 29 U/L (ref 0–44)
AST: 36 U/L (ref 15–41)
Albumin: 3 g/dL — ABNORMAL LOW (ref 3.5–5.0)
Alkaline Phosphatase: 62 U/L (ref 38–126)
Anion gap: 10 (ref 5–15)
BUN: 26 mg/dL — ABNORMAL HIGH (ref 8–23)
CO2: 27 mmol/L (ref 22–32)
Calcium: 8.6 mg/dL — ABNORMAL LOW (ref 8.9–10.3)
Chloride: 103 mmol/L (ref 98–111)
Creatinine, Ser: 0.7 mg/dL (ref 0.61–1.24)
GFR calc Af Amer: >60 mL/min (ref >60)
GFR calc non Af Amer: >60 mL/min (ref >60)
Glucose, Bld: 137 mg/dL — ABNORMAL HIGH (ref 70–99)
Potassium: 3.6 mmol/L (ref 3.5–5.1)
Sodium: 140 mmol/L (ref 135–145)
Total Bilirubin: 0.7 mg/dL (ref 0.3–1.2)
Total Protein: 6.6 g/dL (ref 6.5–8.1)

## 2019-06-20 LAB — CBC WITH DIFFERENTIAL/PLATELET
Abs Immature Granulocytes: 0.07 10*3/uL (ref 0.00–0.07)
Basophils Absolute: 0 10*3/uL (ref 0.0–0.1)
Basophils Relative: 0 %
Eosinophils Absolute: 0 10*3/uL (ref 0.0–0.5)
Eosinophils Relative: 0 %
HCT: 39 % (ref 39.0–52.0)
Hemoglobin: 13.2 g/dL (ref 13.0–17.0)
Immature Granulocytes: 1 %
Lymphocytes Relative: 8 %
Lymphs Abs: 0.8 10*3/uL (ref 0.7–4.0)
MCH: 31.4 pg (ref 26.0–34.0)
MCHC: 33.8 g/dL (ref 30.0–36.0)
MCV: 92.6 fL (ref 80.0–100.0)
Monocytes Absolute: 1.2 10*3/uL — ABNORMAL HIGH (ref 0.1–1.0)
Monocytes Relative: 11 %
Neutro Abs: 8.6 10*3/uL — ABNORMAL HIGH (ref 1.7–7.7)
Neutrophils Relative %: 80 %
Platelets: 345 10*3/uL (ref 150–400)
RBC: 4.21 MIL/uL — ABNORMAL LOW (ref 4.22–5.81)
RDW: 13.4 % (ref 11.5–15.5)
WBC: 10.7 10*3/uL — ABNORMAL HIGH (ref 4.0–10.5)
nRBC: 0 % (ref 0.0–0.2)

## 2019-06-20 LAB — GLUCOSE, CAPILLARY
Glucose-Capillary: 147 mg/dL — ABNORMAL HIGH (ref 70–99)
Glucose-Capillary: 169 mg/dL — ABNORMAL HIGH (ref 70–99)
Glucose-Capillary: 156 mg/dL — ABNORMAL HIGH (ref 70–99)
Glucose-Capillary: 148 mg/dL — ABNORMAL HIGH (ref 70–99)

## 2019-06-20 LAB — D-DIMER, QUANTITATIVE: D-Dimer, Quant: 0.56 ug/mL-FEU — ABNORMAL HIGH (ref 0.00–0.50)

## 2019-06-20 LAB — BRAIN NATRIURETIC PEPTIDE: B Natriuretic Peptide: 47.8 pg/mL (ref 0.0–100.0)

## 2019-06-20 LAB — TSH: TSH: 0.932 u[IU]/mL (ref 0.350–4.500)

## 2019-06-20 LAB — C-REACTIVE PROTEIN: CRP: 8.5 mg/dL — ABNORMAL HIGH (ref <1.0)

## 2019-06-20 LAB — MAGNESIUM: Magnesium: 2.2 mg/dL (ref 1.7–2.4)

## 2019-06-20 NOTE — Progress Notes (Signed)
Progress Note   Patient Demographics:    William Beard, is a 68 y.o. male  MRN: [id number]   DOB - 26-Nov-1950  Admit Date - 06/19/2019  Outpatient Primary MD for the patient is Patient, No Pcp Per    Patient coming from: [facility]  CC - SOB    HPI:    William Beard  is a 68 y.o. 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  Presented to [facility] where he was found to have acute hypoxic respiratory failure due to COVID-19 pneumonia.  He was placed on 5 L nasal cannula oxygen and sent to [facility].   Subjective:    No acute issues or events overnight, patient indicates his respiratory status is slightly better, denies chest pain headache, fevers, chills.    Assessment & Plan:   Active Problems:   COVID-19 virus infection   Acute hypoxic respiratory failure due to acute COVID-19 pneumonitis, POA.   He has moderate disease, minimally improving Continue IV steroids and remdesivir Preliminarily consented for Actemra use if needed -hold off given improvement in CRP the past 24 hours Continued to explain the need for proning, early ambulation, incentive spirometry and flutter SpO2: 93 % O2 Flow Rate (L/min): 5 L/min Recent Labs    06/18/19 2105 06/18/19 2128 06/19/19 0433 06/19/19 0522 06/20/19 0440  DDIMER  --   --   --   --  0.56*  FERRITIN 772*  --   --  800*  --   LDH 277*  --   --   --   --   CRP  --  11.5* 11.9*  --  8.5*    Questionable newly diagnosed A. Fib/flutter.  EKG personally reviewed at  intake, agree with A. Fib/flutter Resolved currently rate controlled in sinus rhythm [place] vas 2 score is 1 patient would not qualify for anticoagulation at this time Continue aspirin 81, metoprolol 25 twice daily  DVT Prophylaxis   Lovenox   Code Status Full Likely DC to  TBD Condition Fair Consults called: None   Admission status: inpt   Time spent in minutes : 35   Physical  Exam:   Vitals  Blood pressure 108/66, pulse 73, temperature 98.3 F (36.8 C), temperature source Oral, resp. rate 16, height 5\' 10"  (1.778 m), weight 99.8 kg, SpO2 93 %.  General:  Pleasantly resting in bed, No acute distress. HEENT:  Normocephalic atraumatic.  Sclerae nonicteric, noninjected.  Extraocular movements intact bilaterally. Neck:  Without mass or deformity.  Trachea is midline. Lungs: Diminished breath sounds bilaterally without overt rhonchi, wheeze, or rales. Heart:  Regular rate and rhythm.  Without murmurs, rubs, or gallops. Abdomen:  Soft, nontender, nondistended.  Without guarding or rebound. Extremities: Without cyanosis, clubbing, edema, or obvious deformity. Vascular:  Dorsalis pedis and posterior tibial pulses palpable bilaterally. Skin:  Warm and dry, no erythema, no ulcerations.     Data Review:   CBC Recent Labs  Lab 06/18/19 2105 06/18/19 2259 06/19/19 0433 06/20/19 0440  WBC 9.8 9.5 7.9 10.7*  HGB 14.3 13.6 13.9 13.2  HCT 39.1 38.8* 40.6 39.0  PLT 304 285 270 345  MCV 85.6 89.2 90.6 92.6  MCH 31.3 31.3 31.0 31.4  MCHC 36.6* 35.1 34.2 33.8  RDW 13.3 13.2 13.2 13.4  LYMPHSABS 0.8  --  0.8 0.8  MONOABS 0.8  --  0.3 1.2*  EOSABS 0.0  --  0.0 0.0  BASOSABS 0.0  --  0.0 0.0   ------------------------------------------------------------------------------------------------------------------  Chemistries  Recent Labs  Lab 06/18/19 2105 06/18/19 2259 06/19/19 0522 06/20/19 0440  NA 131*  --  135 140  K 2.9*  --  3.6 3.6  CL 96*  --  98 103  CO2 19*  --  24 27    GLUCOSE 136*  --  150* 137*  BUN 19  --  19 26*  CREATININE 0.68  --  0.77 0.70  CALCIUM 8.6*  --  8.3* 8.6*  MG  --  2.3 2.6* 2.2  AST 50*  --  47* 36  ALT 31  --  29 29  ALKPHOS 78  --  73 62  BILITOT 1.3*  --  1.2 0.7   ------------------------------------------------------------------------------------------------------------------ estimated creatinine clearance is 103.2 mL/min (by C-G formula based on SCr of 0.7 mg/dL). ------------------------------------------------------------------------------------------------------------------ Recent Labs    06/20/19 0440  TSH 0.932    Coagulation profile No results for input(s): INR, PROTIME in the last 168 hours. ------------------------------------------------------------------------------------------------------------------- Recent Labs    06/20/19 0440  DDIMER 0.56*   -------------------------------------------------------------------------------------------------------------------  Cardiac Enzymes No results for input(s): CKMB, TROPONINI, MYOGLOBIN in the last 168 hours.  Invalid input(s): CK ------------------------------------------------------------------------------------------------------------------    Component Value Date/Time   BNP 47.8 06/20/2019 0440     ---------------------------------------------------------------------------------------------------------------  Urinalysis No results found for: COLORURINE, APPEARANCEUR, LABSPEC, PHURINE, GLUCOSEU, HGBUR, BILIRUBINUR, KETONESUR, PROTEINUR, UROBILINOGEN, NITRITE, LEUKOCYTESUR  ----------------------------------------------------------------------------------------------------------------   Imaging Results:    CT ANGIO CHEST PE W OR WO CONTRAST  Result Date: 06/19/2019 CLINICAL DATA:  Shortness of breath. Weakness. EXAM: CT ANGIOGRAPHY CHEST WITH CONTRAST TECHNIQUE: Multidetector CT imaging of the chest was performed using the standard protocol during  bolus administration of intravenous contrast. Multiplanar CT image reconstructions and MIPs were obtained to evaluate the vascular anatomy. CONTRAST:  72mL OMNIPAQUE IOHEXOL 350 MG/ML SOLN COMPARISON:  Radiograph yesterday. FINDINGS: Cardiovascular: There are no filling defects within the pulmonary arteries to suggest pulmonary embolus. Mild aortic atherosclerosis. No aortic dissection. Mild multi chamber cardiomegaly. No pericardial effusion. Mediastinum/Nodes: No enlarged mediastinal or hilar lymph nodes. Small upper paratracheal nodes are subcentimeter. Decompressed esophagus. No thyroid nodule. Lungs/Pleura: Bilateral heterogeneous primarily ground-glass opacities throughout both lungs, peripheral  and slight basilar predominant distribution. No pleural fluid. Trachea and mainstem bronchi are patent. Upper Abdomen: No acute findings. Musculoskeletal: There are no acute or suspicious osseous abnormalities. Mild scoliosis and degenerative change in the spine. Review of the MIP images confirms the above findings. IMPRESSION: 1. No pulmonary embolus. 2. Bilateral heterogeneous primarily ground-glass opacities throughout both lungs, consistent with COVID-19 pneumonia. 3. Mild cardiomegaly. Aortic Atherosclerosis (ICD10-I70.0). Electronically Signed   By: Keith Rake M.D.   On: 06/19/2019 05:08   US Venous Img Lower Unilateral Right  Result Date: 06/18/2019 CLINICAL DATA:  Pain and swelling EXAM: RIGHT LOWER EXTREMITY VENOUS DOPPLER ULTRASOUND TECHNIQUE: Gray-scale sonography with graded compression, as well as color Doppler and duplex ultrasound were performed to evaluate the lower extremity deep venous systems from the level of the common femoral vein and including the common femoral, femoral, profunda femoral, popliteal and calf veins including the posterior tibial, peroneal and gastrocnemius veins when visible. The superficial great saphenous vein was also interrogated. Spectral Doppler was utilized to  evaluate flow at rest and with distal augmentation maneuvers in the common femoral, femoral and popliteal veins. COMPARISON:  None. FINDINGS: Contralateral Common Femoral Vein: Respiratory phasicity is normal and symmetric with the symptomatic side. No evidence of thrombus. Normal compressibility. Common Femoral Vein: No evidence of thrombus. Normal compressibility, respiratory phasicity and response to augmentation. Saphenofemoral Junction: No evidence of thrombus. Normal compressibility and flow on color Doppler imaging. Profunda Femoral Vein: No evidence of thrombus. Normal compressibility and flow on color Doppler imaging. Femoral Vein: No evidence of thrombus. Normal compressibility, respiratory phasicity and response to augmentation. Popliteal Vein: No evidence of thrombus. Normal compressibility, respiratory phasicity and response to augmentation. Calf Veins: No evidence of thrombus. Normal compressibility and flow on color Doppler imaging. Superficial Great Saphenous Vein: No evidence of thrombus. Normal compressibility. Venous Reflux:  None. Other Findings:  None. IMPRESSION: No evidence of deep venous thrombosis. Electronically Signed   By: Constance Holster M.D.   On: 06/18/2019 23:10   DG Chest Port 1 View  Result Date: 06/18/2019 CLINICAL DATA:  Shortness of breath EXAM: PORTABLE CHEST 1 VIEW COMPARISON:  None. FINDINGS: Low lung volumes with bibasilar atelectasis. Cardiomegaly. No overt edema or effusions. No acute bony abnormality. IMPRESSION: Low lung volumes, bibasilar atelectasis. Electronically Signed   By: Rolm Baptise M.D.   On: 06/18/2019 21:37    My personal review of EKG: Rhythm questionable atrial fibrillation, this was done at [facility] ER, rate in low 100s,    Little Ishikawa DO on 06/20/2019 at 8:23 AM  To page go to [email or web address] - password [facility]

## 2019-06-20 NOTE — Plan of Care (Signed)
Patient doing well at this time.  New admit from yesterday.  Continuing to educate on disease process.

## 2019-06-21 ENCOUNTER — Encounter (HOSPITAL_COMMUNITY): Payer: Self-pay | Admitting: Internal Medicine

## 2019-06-21 LAB — BRAIN NATRIURETIC PEPTIDE: B Natriuretic Peptide: 39.3 pg/mL (ref 0.0–100.0)

## 2019-06-21 LAB — GLUCOSE, CAPILLARY
Glucose-Capillary: 149 mg/dL — ABNORMAL HIGH (ref 70–99)
Glucose-Capillary: 197 mg/dL — ABNORMAL HIGH (ref 70–99)
Glucose-Capillary: 140 mg/dL — ABNORMAL HIGH (ref 70–99)
Glucose-Capillary: 144 mg/dL — ABNORMAL HIGH (ref 70–99)

## 2019-06-21 LAB — COMPREHENSIVE METABOLIC PANEL
ALT: 32 U/L (ref 0–44)
AST: 36 U/L (ref 15–41)
Albumin: 3.1 g/dL — ABNORMAL LOW (ref 3.5–5.0)
Alkaline Phosphatase: 66 U/L (ref 38–126)
Anion gap: 13 (ref 5–15)
BUN: 30 mg/dL — ABNORMAL HIGH (ref 8–23)
CO2: 25 mmol/L (ref 22–32)
Calcium: 8.5 mg/dL — ABNORMAL LOW (ref 8.9–10.3)
Chloride: 101 mmol/L (ref 98–111)
Creatinine, Ser: 0.64 mg/dL (ref 0.61–1.24)
GFR calc Af Amer: >60 mL/min (ref >60)
GFR calc non Af Amer: >60 mL/min (ref >60)
Glucose, Bld: 150 mg/dL — ABNORMAL HIGH (ref 70–99)
Potassium: 3.5 mmol/L (ref 3.5–5.1)
Sodium: 139 mmol/L (ref 135–145)
Total Bilirubin: 0.5 mg/dL (ref 0.3–1.2)
Total Protein: 6.7 g/dL (ref 6.5–8.1)

## 2019-06-21 LAB — CBC WITH DIFFERENTIAL/PLATELET
Abs Immature Granulocytes: 0.05 10*3/uL (ref 0.00–0.07)
Basophils Absolute: 0 10*3/uL (ref 0.0–0.1)
Basophils Relative: 0 %
Eosinophils Absolute: 0 10*3/uL (ref 0.0–0.5)
Eosinophils Relative: 0 %
HCT: 38.7 % — ABNORMAL LOW (ref 39.0–52.0)
Hemoglobin: 13.2 g/dL (ref 13.0–17.0)
Immature Granulocytes: 1 %
Lymphocytes Relative: 7 %
Lymphs Abs: 0.7 10*3/uL (ref 0.7–4.0)
MCH: 31.4 pg (ref 26.0–34.0)
MCHC: 34.1 g/dL (ref 30.0–36.0)
MCV: 92.1 fL (ref 80.0–100.0)
Monocytes Absolute: 0.7 10*3/uL (ref 0.1–1.0)
Monocytes Relative: 7 %
Neutro Abs: 8.4 10*3/uL — ABNORMAL HIGH (ref 1.7–7.7)
Neutrophils Relative %: 85 %
Platelets: 407 10*3/uL — ABNORMAL HIGH (ref 150–400)
RBC: 4.2 MIL/uL — ABNORMAL LOW (ref 4.22–5.81)
RDW: 13.4 % (ref 11.5–15.5)
WBC: 9.9 10*3/uL (ref 4.0–10.5)
nRBC: 0 % (ref 0.0–0.2)

## 2019-06-21 LAB — C-REACTIVE PROTEIN: CRP: 4.2 mg/dL — ABNORMAL HIGH (ref <1.0)

## 2019-06-21 LAB — D-DIMER, QUANTITATIVE: D-Dimer, Quant: 0.54 ug/mL-FEU — ABNORMAL HIGH (ref 0.00–0.50)

## 2019-06-21 LAB — MAGNESIUM: Magnesium: 2.2 mg/dL (ref 1.7–2.4)

## 2019-06-21 NOTE — Evaluation (Signed)
Physical Therapy Evaluation Patient Details Name: William Beard MRN: [id number] DOB: Nov 11, 1950 Today's Date: 06/21/2019   History of Present Illness  William Beard  is a 68 y.o. 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  Presented to [facility] ER where he was found to have acute hypoxic respiratory failure due to COVID-19 pneumonia.  He was placed on 5 L nasal cannula oxygen and sent to [facility]  Clinical Impression  The patient is mobilizing independently but requires supplemental oxygen today.  SPO2 rest 4 L 100%, resting on RA 88%, ambulating RA 84%, ambulating  4 L 88%. Dyspnea 3/4, coughing. Instructed in use of IS and flutter per RN OK. Patient needs reinforcement using interpreter next visit. Pt admitted with above diagnosis.  Pt currently with functional limitations due to the deficits listed below (see PT Problem List). Pt will benefit from skilled PT to increase their independence and safety with mobility to allow discharge to the venue listed below.           Follow Up Recommendations No PT follow up    Equipment Recommendations  None recommended by PT    Recommendations for Other Services       Precautions / Restrictions Precautions Precaution Comments: sats, try titrating from 4 L.  Needs interpreter to better understand.     Mobility  Bed Mobility Overal bed mobility: Independent                Transfers Overall transfer level: Independent                  Ambulation/Gait Ambulation/Gait assistance: Supervision Gait Distance (Feet): 200 Feet Assistive device: None Gait Pattern/deviations: WFL(Within Functional Limits)     General Gait Details: decreased  Stairs            Wheelchair Mobility    Modified Rankin (Stroke Patients Only)        Balance Overall balance assessment: No apparent balance deficits (not formally assessed)                                           Pertinent Vitals/Pain Pain Assessment: No/denies pain    Home Living Family/patient expects to be discharged to:: Private residence Living Arrangements: Children;Spouse/significant other Available Help at Discharge: Family             Additional Comments: will get info  using interpreter, he is independent from mobility standpoint    Prior Function Level of Independence: Independent         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     Hand Dominance   Dominant Hand: Right    Extremity/Trunk Assessment        Lower Extremity Assessment Lower Extremity Assessment: Overall WFL for tasks assessed    Cervical / Trunk Assessment Cervical / Trunk Assessment: Normal  Communication   Communication: Prefers language other than English(needs Spanish interpreter)  Cognition Arousal/Alertness: Awake/alert Behavior During Therapy: WFL for tasks assessed/performed Overall Cognitive Status: Within Functional Limits for tasks assessed  General Comments General comments (skin integrity, edema, etc.): donned pants and shoes sitting down,    Exercises Other Exercises Other Exercises: IS and Flutter x 10- needs interpreter to instruct further.   Assessment/Plan    PT Assessment Patient needs continued PT services  PT Problem List         PT Treatment Interventions Gait training;Therapeutic exercise;Functional mobility training    PT Goals (Current goals can be found in the Care Plan section)  Acute Rehab PT Goals Patient Stated Goal: agreed to ambulate, wants to return home PT Goal Formulation: With patient Time For Goal Achievement: 07/05/19 Potential to Achieve Goals: Good    Frequency Min 3X/week   Barriers to discharge        Co-evaluation                AM-PAC PT "6 Clicks" Mobility  Outcome Measure Help needed turning from your back to your side while in a flat bed without using bedrails?: None Help needed moving from lying on your back to sitting on the side of a flat bed without using bedrails?: None Help needed moving to and from a bed to a chair (including a wheelchair)?: None Help needed standing up from a chair using your arms (e.g., wheelchair or bedside chair)?: None Help needed to walk in hospital room?: None Help needed climbing 3-5 steps with a railing? : A Little 6 Click Score: 23    End of Session Equipment Utilized During Treatment: Oxygen Activity Tolerance: Patient tolerated treatment well Patient left: in chair Nurse Communication: Mobility status PT Visit Diagnosis: Difficulty in walking, not elsewhere classified (R26.2)    Time: 8938-1017 PT Time Calculation (min) (ACUTE ONLY): 39 min   Charges:   PT Evaluation $PT Eval Moderate Complexity: 1 Mod PT Treatments $Gait Training: 8-22 mins $Self Care/Home Management: 8-22        Blanchard Kelch PT Acute Rehabilitation Services Pager [phone] Office [phone]   Rada Hay 06/21/2019, 9:04 AM

## 2019-06-21 NOTE — Progress Notes (Signed)
Progress Note   Patient Demographics:    William Beard, is a 68 y.o. male  MRN: [id number]   DOB - 1950-10-12  Admit Date - 06/19/2019  Outpatient Primary MD for the patient is Patient, No Pcp Per    Patient coming from: [facility]  CC - SOB    HPI:    William Beard  is a 68 y.o. 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  Presented to [facility] ER where he was found to have acute hypoxic respiratory failure due to COVID-19 pneumonia.  He was placed on 5 L nasal cannula oxygen and sent to [place].   Subjective:    No acute events or issues overnight, patient indicates he is having worsening cough today, when he coughs he does report chest pain in a generalized region but this pain resolves immediately after he is done coughing.  Otherwise declines headache, fevers, chills, nausea, vomiting, diarrhea, constipation.    Assessment & Plan:   Active Problems:   COVID-19 virus infection   Acute hypoxic respiratory failure due to acute COVID-19 pneumonitis, POA.   He has moderate disease, moderately improved Continue IV steroids and remdesivir Preliminarily consented for Actemra use if needed -hold off given improvement in CRP since admission Continued to explain the need for proning, early ambulation, incentive spirometry and flutter SpO2: 90 % O2 Flow Rate (L/min): 5 L/min Recent Labs    06/18/19 2105 06/19/19 0433 06/19/19 0522 06/20/19 0440 06/21/19 0510  DDIMER  --   --   --  0.56* 0.54*  FERRITIN 772*  --   800*  --   --   LDH 277*  --   --   --   --   CRP  --  11.9*  --  8.5* 4.2*   Questionable newly diagnosed A. Fib/flutter.  EKG personally reviewed at intake, agree with A. Fib/flutter Resolved currently rate controlled in sinus rhythm [place] vas 2 score is 1 patient would not qualify for anticoagulation at this time Continue aspirin 81, metoprolol 25 twice daily  DVT Prophylaxis   Lovenox   Code Status Full Likely DC to  TBD  Condition Fair Consults called: None   Admission status: inpt   Time spent in minutes : 35   Physical Exam:   Vitals  Blood pressure 107/67, pulse 69, temperature 97.7 F (36.5 C), temperature source Oral, resp. rate 20, height 5\' 10"  (1.778 m), weight 99.8 kg, SpO2 90 %.  General:  Pleasantly resting in bed, No acute distress. HEENT:  Normocephalic atraumatic.  Sclerae nonicteric, noninjected.  Extraocular movements intact bilaterally. Neck:  Without mass or deformity.  Trachea is midline. Lungs: Diminished breath sounds bilaterally without overt rhonchi, wheeze, or rales. Heart:  Regular rate and rhythm.  Without murmurs, rubs, or gallops. Abdomen:  Soft, nontender, nondistended.  Without guarding or rebound. Extremities: Without cyanosis, clubbing, edema, or obvious deformity. Vascular:  Dorsalis pedis and posterior tibial pulses palpable bilaterally. Skin:  Warm and dry, no erythema, no ulcerations.     Data Review:   CBC Recent Labs  Lab 06/18/19 2105 06/18/19 2259 06/19/19 0433 06/20/19 0440 06/21/19 0510  WBC 9.8 9.5 7.9 10.7* 9.9  HGB 14.3 13.6 13.9 13.2 13.2  HCT 39.1 38.8* 40.6 39.0 38.7*  PLT 304 285 270 345 407*  MCV 85.6 89.2 90.6 92.6 92.1  MCH 31.3 31.3 31.0 31.4 31.4  MCHC 36.6* 35.1 34.2 33.8 34.1  RDW 13.3 13.2 13.2 13.4 13.4  LYMPHSABS 0.8  --  0.8 0.8 0.7  MONOABS 0.8  --  0.3 1.2* 0.7  EOSABS 0.0  --  0.0 0.0 0.0  BASOSABS 0.0  --  0.0 0.0 0.0    ------------------------------------------------------------------------------------------------------------------  Chemistries  Recent Labs  Lab 06/18/19 2105 06/18/19 2259 06/19/19 0522 06/20/19 0440 06/21/19 0510  NA 131*  --  135 140 139  K 2.9*  --  3.6 3.6 3.5  CL 96*  --  98 103 101  CO2 19*  --  24 27 25   GLUCOSE 136*  --  150* 137* 150*  BUN 19  --  19 26* 30*  CREATININE 0.68  --  0.77 0.70 0.64  CALCIUM 8.6*  --  8.3* 8.6* 8.5*  MG  --  2.3 2.6* 2.2 2.2  AST 50*  --  47* 36 36  ALT 31  --  29 29 32  ALKPHOS 78  --  73 62 66  BILITOT 1.3*  --  1.2 0.7 0.5   ------------------------------------------------------------------------------------------------------------------ estimated creatinine clearance is 103.2 mL/min (by C-G formula based on SCr of 0.64 mg/dL). ------------------------------------------------------------------------------------------------------------------ Recent Labs    06/20/19 0440  TSH 0.932    Coagulation profile No results for input(s): INR, PROTIME in the last 168 hours. ------------------------------------------------------------------------------------------------------------------- Recent Labs    06/20/19 0440 06/21/19 0510  DDIMER 0.56* 0.54*   -------------------------------------------------------------------------------------------------------------------  Cardiac Enzymes No results for input(s): CKMB, TROPONINI, MYOGLOBIN in the last 168 hours.  Invalid input(s): CK ------------------------------------------------------------------------------------------------------------------    Component Value Date/Time   BNP 39.3 06/21/2019 0510     ---------------------------------------------------------------------------------------------------------------  Urinalysis No results found for: COLORURINE, APPEARANCEUR, LABSPEC, PHURINE, GLUCOSEU, HGBUR, BILIRUBINUR, KETONESUR, PROTEINUR, UROBILINOGEN, NITRITE,  LEUKOCYTESUR  ----------------------------------------------------------------------------------------------------------------   Imaging Results:    No results found.  My personal review of EKG: Rhythm questionable atrial fibrillation, this was done at [facility] ER, rate in low 100s,    06/23/2019 DO on 06/21/2019 at 2:31 PM  To page go to [email or web address] - password [facility]

## 2019-06-22 LAB — CBC WITH DIFFERENTIAL/PLATELET
Abs Immature Granulocytes: 0.08 10*3/uL — ABNORMAL HIGH (ref 0.00–0.07)
Basophils Absolute: 0 10*3/uL (ref 0.0–0.1)
Basophils Relative: 0 %
Eosinophils Absolute: 0 10*3/uL (ref 0.0–0.5)
Eosinophils Relative: 0 %
HCT: 37.8 % — ABNORMAL LOW (ref 39.0–52.0)
Hemoglobin: 12.8 g/dL — ABNORMAL LOW (ref 13.0–17.0)
Immature Granulocytes: 1 %
Lymphocytes Relative: 6 %
Lymphs Abs: 0.7 10*3/uL (ref 0.7–4.0)
MCH: 31.2 pg (ref 26.0–34.0)
MCHC: 33.9 g/dL (ref 30.0–36.0)
MCV: 92.2 fL (ref 80.0–100.0)
Monocytes Absolute: 0.8 10*3/uL (ref 0.1–1.0)
Monocytes Relative: 7 %
Neutro Abs: 10.4 10*3/uL — ABNORMAL HIGH (ref 1.7–7.7)
Neutrophils Relative %: 86 %
Platelets: 398 10*3/uL (ref 150–400)
RBC: 4.1 MIL/uL — ABNORMAL LOW (ref 4.22–5.81)
RDW: 13.2 % (ref 11.5–15.5)
WBC: 12 10*3/uL — ABNORMAL HIGH (ref 4.0–10.5)
nRBC: 0 % (ref 0.0–0.2)

## 2019-06-22 LAB — COMPREHENSIVE METABOLIC PANEL
ALT: 47 U/L — ABNORMAL HIGH (ref 0–44)
AST: 48 U/L — ABNORMAL HIGH (ref 15–41)
Albumin: 3.1 g/dL — ABNORMAL LOW (ref 3.5–5.0)
Alkaline Phosphatase: 58 U/L (ref 38–126)
Anion gap: 10 (ref 5–15)
BUN: 33 mg/dL — ABNORMAL HIGH (ref 8–23)
CO2: 27 mmol/L (ref 22–32)
Calcium: 8.4 mg/dL — ABNORMAL LOW (ref 8.9–10.3)
Chloride: 102 mmol/L (ref 98–111)
Creatinine, Ser: 0.61 mg/dL (ref 0.61–1.24)
GFR calc Af Amer: >60 mL/min (ref >60)
GFR calc non Af Amer: >60 mL/min (ref >60)
Glucose, Bld: 170 mg/dL — ABNORMAL HIGH (ref 70–99)
Potassium: 3.6 mmol/L (ref 3.5–5.1)
Sodium: 139 mmol/L (ref 135–145)
Total Bilirubin: 1 mg/dL (ref 0.3–1.2)
Total Protein: 6.4 g/dL — ABNORMAL LOW (ref 6.5–8.1)

## 2019-06-22 LAB — GLUCOSE, CAPILLARY
Glucose-Capillary: 173 mg/dL — ABNORMAL HIGH (ref 70–99)
Glucose-Capillary: 276 mg/dL — ABNORMAL HIGH (ref 70–99)
Glucose-Capillary: 249 mg/dL — ABNORMAL HIGH (ref 70–99)
Glucose-Capillary: 140 mg/dL — ABNORMAL HIGH (ref 70–99)

## 2019-06-22 LAB — BRAIN NATRIURETIC PEPTIDE: B Natriuretic Peptide: 23.3 pg/mL (ref 0.0–100.0)

## 2019-06-22 LAB — C-REACTIVE PROTEIN: CRP: 1.8 mg/dL — ABNORMAL HIGH (ref <1.0)

## 2019-06-22 LAB — MAGNESIUM: Magnesium: 2.3 mg/dL (ref 1.7–2.4)

## 2019-06-22 LAB — D-DIMER, QUANTITATIVE: D-Dimer, Quant: 0.43 ug/mL-FEU (ref 0.00–0.50)

## 2019-06-22 MED ORDER — ALUM & MAG HYDROXIDE-SIMETH 200-200-20 MG/5ML PO SUSP
15.0000 mL | Freq: Four times a day (QID) | ORAL | Status: DC | PRN
  Administered 2019-06-22: 15 mL via ORAL
  Filled 2019-06-22 (×2): qty 30

## 2019-06-22 MED ORDER — SALINE SPRAY 0.65 % NA SOLN
1.0000 | NASAL | Status: DC | PRN
  Filled 2019-06-22: qty 44

## 2019-06-22 NOTE — Progress Notes (Signed)
SATURATION QUALIFICATIONS: (This note is used to comply with regulatory documentation for home oxygen)  Patient Saturations on Room Air at Rest = 88%    Patient Saturations on 4 Liters of oxygen while Ambulating = 79-82%  Please briefly explain why patient needs home oxygen:  Pt is from [place], he is a Administrator. 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 Pt will be driving back home across country. Felt that pt is not a good candidate for home oxygen therapy.

## 2019-06-22 NOTE — Progress Notes (Signed)
Occupational Therapy Evaluation Patient Details Name: William Beard MRN: [id number] DOB: 12/18/1950 Today's Date: 06/22/2019    History of Present Illness William Beard  is a 68 y.o. 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  Presented to [facility] ER where he was found to have acute hypoxic respiratory failure due to COVID-19 pneumonia.  He was placed on 5 L nasal cannula oxygen and sent to [place] Interpreter Laverna Peace) used during session. PTA, pt independent and was very active, walking and cycling regularly. Pt able to complete ADL and functional mobility however desats to 80 on 3L. Ambulated in hallway @ 400 ft with 3 rest breaks with SpO2 desat to 79 on 3L. Took @ 10 min to rebound to 90 on 3L. Reinforced use of incentive spirometer and flutter valve. Educated pt on importance of sleeping prone. MD notified that pt would like him to call and update his daughter. Will follow acutely.  Recommend nsg staff ambulate pt.    Follow Up Recommendations  No OT follow up    Equipment Recommendations  None recommended by OT    Recommendations for Other Services       Precautions / Restrictions Precautions Precaution Comments: SpO2 drop      Mobility Bed Mobility Overal bed mobility: Independent                Transfers Overall transfer level: Independent                    Balance Overall balance assessment: No apparent balance deficits (not formally assessed)                                         ADL either performed or assessed with clinical judgement   ADL Overall ADL's : Needs assistance/impaired                                     Functional mobility during ADLs: Modified independent General ADL  Comments: Pt able to complete ADL at standing level with 1/4 DOE adn desats to 80 on 2L. Educated through [place] [employer] interpreter on pursed lip breathing.      Vision         Perception     Praxis      Pertinent Vitals/Pain Pain Assessment: No/denies pain     Hand Dominance Right   Extremity/Trunk Assessment Upper Extremity Assessment Upper Extremity Assessment: Overall WFL for tasks assessed   Lower Extremity Assessment Lower Extremity Assessment: Defer to PT evaluation   Cervical / Trunk Assessment Cervical / Trunk Assessment: Normal   Communication Communication Communication: Prefers language other than English   Cognition Arousal/Alertness: Awake/alert Behavior During Therapy: WFL for tasks assessed/performed Overall Cognitive Status: Within Functional Limits for tasks assessed                                     General Comments       Exercises Exercises: Other exercises Other Exercises Other Exercises: Pt completed flutter valve  adn incentive spirometer wiht interpreter educating on appropriiate use Other Exercises: Educated on importance of spending time on his stomach   Shoulder Instructions      Home Living Family/patient expects to be discharged to:: Private residence Living Arrangements: Children;Spouse/significant other Available Help at Discharge: Family                                    Prior Functioning/Environment Level of Independence: Independent        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; very active PTA; walking; cycling        OT Problem List: Decreased activity tolerance;Decreased knowledge of use of DME or AE;Cardiopulmonary status limiting activity      OT Treatment/Interventions: Self-care/ADL training;Therapeutic exercise;Neuromuscular education;Energy conservation;DME and/or AE instruction;Therapeutic activities;Patient/family education    OT Goals(Current goals can be found in the care plan section)  Acute Rehab OT Goals Patient Stated Goal: to get better OT Goal Formulation: With patient Time For Goal Achievement: 07/06/19 Potential to Achieve Goals: Good  OT Frequency: Min 3X/week   Barriers to D/C: Other (comment)  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[place]       Co-evaluation              AM-PAC OT "6 Clicks" Daily Activity     Outcome Measure Help from another person eating meals?: None Help from another person taking care of personal grooming?: None Help from another person toileting, which includes using toliet, bedpan, or urinal?: None Help from another person bathing (including washing, rinsing, drying)?: A Little Help from another person to put on and taking off regular upper body clothing?: None Help from another person to put on and taking off regular lower body clothing?: A Little 6 Click Score: 22   End of Session Equipment Utilized During Treatment: Oxygen(3-4L) Nurse Communication: Mobility status;Other (comment)(O2 needs)  Activity Tolerance: Patient tolerated treatment well Patient left: in chair;with call bell/phone within reach  OT Visit Diagnosis: Muscle weakness (generalized) (M62.81)                Time: 0920-1000 OT Time Calculation (min): 40 min Charges:  OT General Charges $OT Visit: 1 Visit OT Evaluation $OT Eval Moderate Complexity: 1 Mod OT Treatments $Self Care/Home Management : 8-22 mins $Therapeutic Activity: 8-22 mins  Luisa Dago, OT/L   Acute OT Clinical Specialist Acute Rehabilitation Services Pager [phone] Office [phone]   [facility] 06/22/2019, 12:44 PM

## 2019-06-22 NOTE — Progress Notes (Signed)
Progress Note   Patient Demographics:    William Beard, is a 68 y.o. male  MRN: [id number]   DOB - 09-Feb-1951  Admit Date - 06/19/2019  Outpatient Primary MD for the patient is Patient, No Pcp Per    Patient coming from: [facility]  CC - SOB    HPI:    William Beard  is a 68 y.o. 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  Presented to [facility] ER where he was found to have acute hypoxic respiratory failure due to COVID-19 pneumonia.  He was placed on 5 L nasal cannula oxygen and sent to [place].   Subjective:    No acute events or issues overnight, patient continues to complain of mild dyspnea with exertion, otherwise declines chest pain, nausea, vomiting, diarrhea, constipation, headache, fevers, chills.    Assessment & Plan:   Active Problems:   COVID-19 virus infection   Acute hypoxic respiratory failure due to acute COVID-19 pneumonitis, POA.   He has moderate disease, continues to improve, not yet back to baseline Continue IV steroids; patient has now completed remdesivir course Preliminarily consented for Actemra use if needed -hold off given improvement in CRP and symptoms since admission Continued to explain the need for proning, early ambulation, incentive spirometry and flutter SpO2: (!) 89 % O2 Flow Rate (L/min): (S) 2 L/min Recent Labs    06/20/19 0440 06/21/19 0510 06/22/19 0400  DDIMER 0.56* 0.54* 0.43  CRP 8.5* 4.2* 1.8*   Questionable newly diagnosed A. Fib/flutter.  EKG personally  reviewed at intake, agree with A. Fib/flutter Resolved currently rate controlled in sinus rhythm [place] vas 2 score is 1 patient would not qualify for anticoagulation at this time Continue aspirin 81, metoprolol 25 twice daily  DVT Prophylaxis   Lovenox   Code Status Full Disposition: Lengthy discussion today with patient, and staff about patient's difficult disposition.  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  At this time patient continues to desat with exertion although at rest on room air patient is 89 to 90%.  Over the next 24 to 48 hours  we will continue O2 ambulation, once able to saturate well on 2 to 3 L nasal cannula would consider discharge.  Will need transport back to his truck and subsequently will drive back to [place] on his own.  Hopeful disposition in the next 24 to 48 hours again pending clinical improvement and improvement in hypoxia. Condition Fair   Admission status: inpt, continues to require IV medications, oxygen well above baseline and close monitoring given increased risk for decompensation Time spent in minutes : 35   Physical Exam:   Vitals  Blood pressure 107/62, pulse 84, temperature 98.2 F (36.8 C), temperature source Oral, resp. rate 17, height 5\' 10"  (1.778 m), weight 99.8 kg, SpO2 (!) 89 %.  General:  Pleasantly resting in bed, No acute distress. HEENT:  Normocephalic atraumatic.  Sclerae nonicteric, noninjected.  Extraocular movements intact bilaterally. Neck:  Without mass or deformity.  Trachea is midline. Lungs: Diminished breath sounds bilaterally without overt rhonchi, wheeze, or rales. Heart:  Regular rate and rhythm.  Without murmurs, rubs, or gallops. Abdomen:  Soft, nontender, nondistended.  Without guarding or rebound. Extremities: Without cyanosis, clubbing, edema, or obvious deformity. Vascular:  Dorsalis pedis and posterior tibial pulses palpable bilaterally. Skin:  Warm and dry, no erythema, no  ulcerations.     Data Review:   CBC Recent Labs  Lab 06/18/19 2105 06/18/19 2259 06/19/19 0433 06/20/19 0440 06/21/19 0510 06/22/19 0400  WBC 9.8 9.5 7.9 10.7* 9.9 12.0*  HGB 14.3 13.6 13.9 13.2 13.2 12.8*  HCT 39.1 38.8* 40.6 39.0 38.7* 37.8*  PLT 304 285 270 345 407* 398  MCV 85.6 89.2 90.6 92.6 92.1 92.2  MCH 31.3 31.3 31.0 31.4 31.4 31.2  MCHC 36.6* 35.1 34.2 33.8 34.1 33.9  RDW 13.3 13.2 13.2 13.4 13.4 13.2  LYMPHSABS 0.8  --  0.8 0.8 0.7 0.7  MONOABS 0.8  --  0.3 1.2* 0.7 0.8  EOSABS 0.0  --  0.0 0.0 0.0 0.0  BASOSABS 0.0  --  0.0 0.0 0.0 0.0   ------------------------------------------------------------------------------------------------------------------  Chemistries  Recent Labs  Lab 06/18/19 2105 06/18/19 2259 06/19/19 0522 06/20/19 0440 06/21/19 0510 06/22/19 0400  NA 131*  --  135 140 139 139  K 2.9*  --  3.6 3.6 3.5 3.6  CL 96*  --  98 103 101 102  CO2 19*  --  24 27 25 27   GLUCOSE 136*  --  150* 137* 150* 170*  BUN 19  --  19 26* 30* 33*  CREATININE 0.68  --  0.77 0.70 0.64 0.61  CALCIUM 8.6*  --  8.3* 8.6* 8.5* 8.4*  MG  --  2.3 2.6* 2.2 2.2 2.3  AST 50*  --  47* 36 36 48*  ALT 31  --  29 29 32 47*  ALKPHOS 78  --  73 62 66 58  BILITOT 1.3*  --  1.2 0.7 0.5 1.0   ------------------------------------------------------------------------------------------------------------------ estimated creatinine clearance is 103.2 mL/min (by C-G formula based on SCr of 0.61 mg/dL). ------------------------------------------------------------------------------------------------------------------ Recent Labs    06/20/19 0440  TSH 0.932    Coagulation profile No results for input(s): INR, PROTIME in the last 168 hours. ------------------------------------------------------------------------------------------------------------------- Recent Labs    06/21/19 0510 06/22/19 0400  DDIMER 0.54* 0.43    -------------------------------------------------------------------------------------------------------------------  Cardiac Enzymes No results for input(s): CKMB, TROPONINI, MYOGLOBIN in the last 168 hours.  Invalid input(s): CK ------------------------------------------------------------------------------------------------------------------    Component Value Date/Time   BNP 23.3 06/22/2019 0400     ---------------------------------------------------------------------------------------------------------------  Urinalysis No  results found for: COLORURINE, APPEARANCEUR, LABSPEC, PHURINE, GLUCOSEU, HGBUR, BILIRUBINUR, KETONESUR, PROTEINUR, UROBILINOGEN, NITRITE, LEUKOCYTESUR  ----------------------------------------------------------------------------------------------------------------   Imaging Results:    No results found.  My personal review of EKG: Rhythm questionable atrial fibrillation, this was done at [facility] ER, rate in low 100s,   Azucena Fallen DO on 06/22/2019 at 3:59 PM  To page go to [email or web address] - password [facility]

## 2019-06-23 LAB — CULTURE, BLOOD (ROUTINE X 2)
Culture: NO GROWTH
Culture: NO GROWTH
Special Requests: ADEQUATE

## 2019-06-23 LAB — GLUCOSE, CAPILLARY
Glucose-Capillary: 184 mg/dL — ABNORMAL HIGH (ref 70–99)
Glucose-Capillary: 201 mg/dL — ABNORMAL HIGH (ref 70–99)
Glucose-Capillary: 223 mg/dL — ABNORMAL HIGH (ref 70–99)
Glucose-Capillary: 200 mg/dL — ABNORMAL HIGH (ref 70–99)

## 2019-06-23 MED ORDER — METHYLPREDNISOLONE SODIUM SUCC 40 MG IJ SOLR
40.0000 mg | Freq: Two times a day (BID) | INTRAMUSCULAR | Status: DC
  Administered 2019-06-23 – 2019-06-28 (×12): 40 mg via INTRAVENOUS
  Filled 2019-06-23 (×11): qty 1

## 2019-06-23 NOTE — Progress Notes (Signed)
Spoke to daughter about patient and answered questions, brought phone into room but she said she already spoke to him

## 2019-06-23 NOTE — Progress Notes (Signed)
Progress Note   Patient Demographics:    William Beard, is a 68 y.o. male  MRN: [id number]   DOB - 1951-01-28  Admit Date - 06/19/2019  Outpatient Primary MD for the patient is Patient, No Pcp Per    Patient coming from: [facility]  CC - SOB    HPI:    William Beard  is a 68 y.o. 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  Presented to [facility] ER where he was found to have acute hypoxic respiratory failure due to COVID-19 pneumonia.  He was placed on 5 L nasal cannula oxygen and sent to [place].   Subjective:    Overnight patient had mild desaturations, unfortunately this morning patient was placed back on 10 L high flow nasal cannula, was able to wean down back to room air saturating 88 to 52% which is certainly reasonable given his current diagnosis.  Otherwise patient declines any headache, chest pain, fevers, chills, nausea, vomiting, diarrhea, constipation.  Patient does report GERD/reflux worse with laying supine or exertion questing medication.    Assessment & Plan:   Active Problems:   COVID-19 virus infection   Acute hypoxic respiratory failure due to acute COVID-19 pneumonitis, POA.   He has moderate disease, continues to improve, not yet back to baseline Patient currently on room air at rest at 89% appears comfortable, follow ambulatory walk screen Continue IV steroids; patient has now completed remdesivir course Preliminarily consented for Actemra use if needed -hold off given improvement in CRP and  symptoms since admission Continued to explain the need for proning, early ambulation, incentive spirometry and flutter  Recent Labs    06/21/19 0510 06/22/19 0400  DDIMER 0.54* 0.43  CRP 4.2* 1.8*   Questionable newly diagnosed A. Fib/flutter.  EKG personally reviewed at intake, agree with A. Fib/flutter Resolved currently rate controlled in sinus rhythm [place] vas 2 score is 1 patient would not qualify for anticoagulation at this time Continue aspirin 81, metoprolol 25 twice daily  DVT Prophylaxis   Lovenox   Code Status Full Disposition: Lengthy discussion today with patient, and staff about patient's difficult disposition.  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  At this time patient continues to desat with exertion although at rest on room air patient is 89 to 90%.  Over the next 24 to 48 hours we will continue O2 ambulation, once able to saturate well on 2 to 3 L nasal cannula would consider discharge.  Will need transport back to his truck and subsequently will drive back to [place] on his own.  Hopeful disposition in the next 24 to 48 hours again pending clinical improvement and improvement in hypoxia. Condition Fair   Admission status: inpt, continues to require IV medications, oxygen well above baseline and close monitoring given increased risk for decompensation Time spent in minutes : 35   Physical Exam:   Vitals  Blood pressure 113/76, pulse 79, temperature 97.8 F (36.6 C), temperature source Oral, resp. rate 19, height 5\' 10"  (1.778 m), weight 99.8 kg, SpO2 91 %.  General:  Pleasantly resting in bed, No acute distress. HEENT:  Normocephalic atraumatic.  Sclerae nonicteric, noninjected.  Extraocular movements intact bilaterally. Neck:  Without mass or deformity.  Trachea is midline. Lungs: Diminished breath sounds bilaterally without overt rhonchi, wheeze, or rales. Heart:  Regular rate and rhythm.  Without murmurs, rubs, or  gallops. Abdomen:  Soft, nontender, nondistended.  Without guarding or rebound. Extremities: Without cyanosis, clubbing, edema, or obvious deformity. Vascular:  Dorsalis pedis and posterior tibial pulses palpable bilaterally. Skin:  Warm and dry, no erythema, no ulcerations.     Data Review:   CBC Recent Labs  Lab 06/18/19 2105 06/18/19 2259 06/19/19 0433 06/20/19 0440 06/21/19 0510 06/22/19 0400  WBC 9.8 9.5 7.9 10.7* 9.9 12.0*  HGB 14.3 13.6 13.9 13.2 13.2 12.8*  HCT 39.1 38.8* 40.6 39.0 38.7* 37.8*  PLT 304 285 270 345 407* 398  MCV 85.6 89.2 90.6 92.6 92.1 92.2  MCH 31.3 31.3 31.0 31.4 31.4 31.2  MCHC 36.6* 35.1 34.2 33.8 34.1 33.9  RDW 13.3 13.2 13.2 13.4 13.4 13.2  LYMPHSABS 0.8  --  0.8 0.8 0.7 0.7  MONOABS 0.8  --  0.3 1.2* 0.7 0.8  EOSABS 0.0  --  0.0 0.0 0.0 0.0  BASOSABS 0.0  --  0.0 0.0 0.0 0.0   ------------------------------------------------------------------------------------------------------------------  Chemistries  Recent Labs  Lab 06/18/19 2105 06/18/19 2259 06/19/19 0522 06/20/19 0440 06/21/19 0510 06/22/19 0400  NA 131*  --  135 140 139 139  K 2.9*  --  3.6 3.6 3.5 3.6  CL 96*  --  98 103 101 102  CO2 19*  --  24 27 25 27   GLUCOSE 136*  --  150* 137* 150* 170*  BUN 19  --  19 26* 30* 33*  CREATININE 0.68  --  0.77 0.70 0.64 0.61  CALCIUM 8.6*  --  8.3* 8.6* 8.5* 8.4*  MG  --  2.3 2.6* 2.2 2.2 2.3  AST 50*  --  47* 36 36 48*  ALT 31  --  29 29 32 47*  ALKPHOS 78  --  73 62 66 58  BILITOT 1.3*  --  1.2 0.7 0.5 1.0  ------------------------------------------------------------------------------------------------------------------    Component Value Date/Time   BNP 23.3 06/22/2019 0400     ---------------------------------------------------------------------------------------------------------------  Urinalysis No results found for: COLORURINE, APPEARANCEUR, LABSPEC, PHURINE, GLUCOSEU, HGBUR, BILIRUBINUR, KETONESUR, PROTEINUR,  UROBILINOGEN, NITRITE, LEUKOCYTESUR  ----------------------------------------------------------------------------------------------------------------   Imaging Results:    No results found.  My personal review of EKG: Rhythm questionable atrial fibrillation, this was  done at [facility] ER, rate in low 100s,   Azucena Fallen DO on 06/23/2019 at 3:51 PM  To page go to [email or web address] - password [facility]

## 2019-06-24 LAB — GLUCOSE, CAPILLARY
Glucose-Capillary: 156 mg/dL — ABNORMAL HIGH (ref 70–99)
Glucose-Capillary: 256 mg/dL — ABNORMAL HIGH (ref 70–99)
Glucose-Capillary: 215 mg/dL — ABNORMAL HIGH (ref 70–99)

## 2019-06-24 NOTE — Progress Notes (Signed)
Progress Note   Patient Demographics:    William Beard, is a 68 y.o. male  MRN: [id number]   DOB - 12-02-1950  Admit Date - 06/19/2019  Outpatient Primary MD for the patient is Patient, No Pcp Per    Patient coming from: [facility]  CC - SOB    HPI:    William Beard  is a 68 y.o. 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  Presented to [facility] where he was found to have acute hypoxic respiratory failure due to COVID-19 pneumonia.  He was placed on 5 L nasal cannula oxygen and sent to [facility].   Subjective:    Overnight patient had mild desaturations, unfortunately this morning patient was placed back on 10 L high flow nasal cannula, was able to wean down back to room air saturating 88 to 89% which is certainly reasonable given his current diagnosis.  Otherwise patient declines any headache, chest pain, fevers, chills, nausea, vomiting, diarrhea, constipation.  Patient does report GERD/reflux worse with laying supine or exertion questing medication.    Assessment & Plan:   Active Problems:   COVID-19 virus infection   Acute hypoxic respiratory failure due to acute COVID-19 pneumonitis, POA.   He has moderate disease, continues to improve, not yet back to baseline Patient's oxygen remains quite labile, patient desats to low 80s on exertion with prolonged recovery phase.   After resting in recovery patient on room air remains around 88 to 90%, however requires upwards of 6 L nasal cannula during recovery phase to  maintain sats. Continue IV steroids; patient has now completed remdesivir course Preliminarily consented for Actemra use if needed -hold off given improvement in CRP and symptoms since admission Continued to explain the need for proning, early ambulation, incentive spirometry and flutter Recent Labs    06/22/19 0400  DDIMER 0.43  CRP 1.8*   Questionable newly diagnosed A. Fib/flutter.  EKG personally reviewed at intake, agree with A. Fib/flutter Resolved currently rate controlled in sinus rhythm [place] vas 2 score is 1 patient would not qualify for anticoagulation at this time Continue aspirin 81, metoprolol 25 twice daily  DVT Prophylaxis   Lovenox   Code Status Full Disposition: Lengthy discussion today with patient, and staff about patient's difficult disposition.  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  At this time patient continues to desat with exertion even on oxygen although at rest on room air patient is 89 to 90%.  Over the next 24 to 48 hours we will continue O2 ambulation, once able to saturate well on 2 to 3 L nasal cannula would consider discharge.  Will need transport back to his truck and subsequently will drive back to [place] on his own.  Hopeful disposition in the next 24 to 48 hours again pending clinical improvement and improvement in hypoxia. Condition Fair   Admission status: inpt, continues to require IV medications, oxygen well above baseline and close monitoring given increased risk for decompensation Time spent in minutes : 35   Physical Exam:   Vitals  Blood pressure 112/69, pulse 71, temperature 98 F (36.7 C), temperature source Oral, resp. rate 18, height 5\' 10"  (1.778 m), weight 99.8 kg, SpO2 90 %.  General:  Pleasantly resting in bed, No acute distress. HEENT:  Normocephalic atraumatic.  Sclerae nonicteric, noninjected.  Extraocular movements intact bilaterally. Neck:  Without mass or deformity.  Trachea  is midline. Lungs: Diminished breath sounds bilaterally without overt rhonchi, wheeze, or rales. Heart:  Regular rate and rhythm.  Without murmurs, rubs, or gallops. Abdomen:  Soft, nontender, nondistended.  Without guarding or rebound. Extremities: Without cyanosis, clubbing, edema, or obvious deformity. Vascular:  Dorsalis pedis and posterior tibial pulses palpable bilaterally. Skin:  Warm and dry, no erythema, no ulcerations.     Data Review:   CBC Recent Labs  Lab 06/18/19 2105 06/18/19 2259 06/19/19 0433 06/20/19 0440 06/21/19 0510 06/22/19 0400  WBC 9.8 9.5 7.9 10.7* 9.9 12.0*  HGB 14.3 13.6 13.9 13.2 13.2 12.8*  HCT 39.1 38.8* 40.6 39.0 38.7* 37.8*  PLT 304 285 270 345 407* 398  MCV 85.6 89.2 90.6 92.6 92.1 92.2  MCH 31.3 31.3 31.0 31.4 31.4 31.2  MCHC 36.6* 35.1 34.2 33.8 34.1 33.9  RDW 13.3 13.2 13.2 13.4 13.4 13.2  LYMPHSABS 0.8  --  0.8 0.8 0.7 0.7  MONOABS 0.8  --  0.3 1.2* 0.7 0.8  EOSABS 0.0  --  0.0 0.0 0.0 0.0  BASOSABS 0.0  --  0.0 0.0 0.0 0.0   ------------------------------------------------------------------------------------------------------------------  Chemistries  Recent Labs  Lab 06/18/19 2105 06/18/19 2259 06/19/19 0522 06/20/19 0440 06/21/19 0510 06/22/19 0400  NA 131*  --  135 140 139 139  K 2.9*  --  3.6 3.6 3.5 3.6  CL 96*  --  98 103 101 102  CO2 19*  --  24 27 25 27   GLUCOSE 136*  --  150* 137* 150* 170*  BUN 19  --  19 26* 30* 33*  CREATININE 0.68  --  0.77 0.70 0.64 0.61  CALCIUM 8.6*  --  8.3* 8.6* 8.5* 8.4*  MG  --  2.3 2.6* 2.2 2.2 2.3  AST 50*  --  47* 36 36 48*  ALT 31  --  29 29 32 47*  ALKPHOS 78  --  73 62 66 58  BILITOT 1.3*  --  1.2 0.7 0.5 1.0  ------------------------------------------------------------------------------------------------------------------    Component Value Date/Time   BNP 23.3 06/22/2019 0400      ---------------------------------------------------------------------------------------------------------------  Urinalysis No results found for: COLORURINE, APPEARANCEUR, LABSPEC, PHURINE, GLUCOSEU, HGBUR, BILIRUBINUR, KETONESUR,  PROTEINUR, UROBILINOGEN, NITRITE, LEUKOCYTESUR  ----------------------------------------------------------------------------------------------------------------   Imaging Results:    No results found.  My personal review of EKG: Rhythm questionable atrial fibrillation, this was done at [facility] ER, rate in low 100s,   Little Ishikawa DO on 06/24/2019 at 8:33 AM  To page go to [email or web address] - password [email or web address]

## 2019-06-24 NOTE — Progress Notes (Signed)
Patient desats with activity, it takes him a while to recover. Oxygen turned up, now on 6L-7L HFNC. Patient also having dyspnea at rest.

## 2019-06-25 LAB — GLUCOSE, CAPILLARY
Glucose-Capillary: 163 mg/dL — ABNORMAL HIGH (ref 70–99)
Glucose-Capillary: 308 mg/dL — ABNORMAL HIGH (ref 70–99)
Glucose-Capillary: 235 mg/dL — ABNORMAL HIGH (ref 70–99)
Glucose-Capillary: 136 mg/dL — ABNORMAL HIGH (ref 70–99)

## 2019-06-25 NOTE — Progress Notes (Signed)
Progress Note   Patient Demographics:    William Beard, is a 68 y.o. male  MRN: [id number]   DOB - 1950-11-01  Admit Date - 06/19/2019  Outpatient Primary MD for the patient is Patient, No Pcp Per    Patient coming from: [facility]  CC - SOB    HPI:    William Beard  is a 68 y.o. 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  Presented to [facility] where he was found to have acute hypoxic respiratory failure due to COVID-19 pneumonia.  He was placed on 5 L nasal cannula oxygen and sent to [facility].   Subjective:    No events overnight, patient continues to maintain saturations on room air at rest at 89 to 91%.  Patient does continue to desaturate quite markedly with exertion.  Deviously requiring 6 L nasal cannula and still having desaturations into the low 80s.  Lengthy discussion at bedside with patient today about need for less than 4 L nasal cannula with any exertion as well as being on room air at rest before we could discuss discharge given his request to discharge to his truck and drive back to [place] himself.  He denies any overt shortness of breath, nausea, vomiting, diarrhea, constipation, headache, fevers, chills.    Assessment & Plan:   Active Problems:   COVID-19 virus infection   Acute hypoxic respiratory failure due to acute COVID-19 pneumonitis, POA.   He has moderate disease, continues to improve, not yet back to baseline 89 to 91% on room air at rest, 79% on room air while walking On 6 L  nasal cannula patient continues to desat into the mid 80s, an improvement from yesterday Continue IV steroids taper  Patient has now completed remdesivir course Preliminarily consented for Actemra use if needed - not administered given improvement Continued to explain the need for proning, early ambulation, incentive spirometry and flutter  Transient episode of A. Fib/flutter, likely provoked given above, resolved.  EKG personally reviewed at intake, agree with A. Fib/flutter Resolved currently rate controlled in sinus rhythm [place] vas 2 score is 1 patient would not qualify  for anticoagulation at this time Continue aspirin 81, metoprolol 25 twice daily  DVT Prophylaxis   Lovenox   Code Status Full Disposition: Lengthy discussion today with patient, and staff about patient's difficult disposition.  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  At this time patient continues to desat with exertion even on oxygen although at rest on room air patient is 89 to 90%.  Over the next 24 to 48 hours we will continue O2 ambulation, once able to saturate well on 2 to 3 L nasal cannula would consider discharge.  Will need transport back to his truck and subsequently will drive back to [place] on his own.  Hopeful disposition in the next 24 to 48 hours again pending clinical improvement and improvement in hypoxia.  Condition Fair   Admission status: inpt, continues to require IV medications, oxygen well above baseline and close monitoring given increased risk for decompensation Time spent in minutes : 35   Physical Exam:   Vitals  Blood pressure 126/78, pulse 80, temperature 97.6 F (36.4 C), temperature source Oral, resp. rate 18, height 5\' 10"  (1.778 m), weight 99.8 kg, SpO2 (!) 89 %.  General:  Pleasantly resting in bed, No acute distress. HEENT:  Normocephalic atraumatic.  Sclerae nonicteric, noninjected.  Extraocular movements intact bilaterally. Neck:   Without mass or deformity.  Trachea is midline. Lungs: Diminished breath sounds bilaterally without overt rhonchi, wheeze, or rales. Heart:  Regular rate and rhythm.  Without murmurs, rubs, or gallops. Abdomen:  Soft, nontender, nondistended.  Without guarding or rebound. Extremities: Without cyanosis, clubbing, edema, or obvious deformity. Vascular:  Dorsalis pedis and posterior tibial pulses palpable bilaterally. Skin:  Warm and dry, no erythema, no ulcerations.     Data Review:   CBC Recent Labs  Lab 06/18/19 2105 06/18/19 2259 06/19/19 0433 06/20/19 0440 06/21/19 0510 06/22/19 0400  WBC 9.8 9.5 7.9 10.7* 9.9 12.0*  HGB 14.3 13.6 13.9 13.2 13.2 12.8*  HCT 39.1 38.8* 40.6 39.0 38.7* 37.8*  PLT 304 285 270 345 407* 398  MCV 85.6 89.2 90.6 92.6 92.1 92.2  MCH 31.3 31.3 31.0 31.4 31.4 31.2  MCHC 36.6* 35.1 34.2 33.8 34.1 33.9  RDW 13.3 13.2 13.2 13.4 13.4 13.2  LYMPHSABS 0.8  --  0.8 0.8 0.7 0.7  MONOABS 0.8  --  0.3 1.2* 0.7 0.8  EOSABS 0.0  --  0.0 0.0 0.0 0.0  BASOSABS 0.0  --  0.0 0.0 0.0 0.0   ------------------------------------------------------------------------------------------------------------------  Chemistries  Recent Labs  Lab 06/18/19 2105 06/18/19 2259 06/19/19 0522 06/20/19 0440 06/21/19 0510 06/22/19 0400  NA 131*  --  135 140 139 139  K 2.9*  --  3.6 3.6 3.5 3.6  CL 96*  --  98 103 101 102  CO2 19*  --  24 27 25 27   GLUCOSE 136*  --  150* 137* 150* 170*  BUN 19  --  19 26* 30* 33*  CREATININE 0.68  --  0.77 0.70 0.64 0.61  CALCIUM 8.6*  --  8.3* 8.6* 8.5* 8.4*  MG  --  2.3 2.6* 2.2 2.2 2.3  AST 50*  --  47* 36 36 48*  ALT 31  --  29 29 32 47*  ALKPHOS 78  --  73 62 66 58  BILITOT 1.3*  --  1.2 0.7 0.5 1.0  ------------------------------------------------------------------------------------------------------------------    Component Value Date/Time   BNP 23.3 06/22/2019 0400      ---------------------------------------------------------------------------------------------------------------  Urinalysis No results found for: COLORURINE, APPEARANCEUR, LABSPEC, PHURINE, GLUCOSEU, HGBUR, BILIRUBINUR, KETONESUR, PROTEINUR, UROBILINOGEN, NITRITE, LEUKOCYTESUR  ----------------------------------------------------------------------------------------------------------------   Imaging Results:    No results found.  My personal review of EKG: Rhythm questionable atrial fibrillation, this was done at [facility] ER, rate in low 100s,   Azucena Fallen DO on 06/25/2019 at 3:58 PM  To page go to [email or web address] - password [email or web address]

## 2019-06-25 NOTE — Progress Notes (Addendum)
SATURATION QUALIFICATIONS: (This note is used to comply with regulatory documentation for home oxygen)  Patient Saturations on Room Air at Rest = 89-91%  Patient Saturations on Room Air while Ambulating = 79%  Patient Saturations on 4Liters of oxygen while Ambulating = 82-86% Patient Saturations on 6Liters of oxygen while Ambulating = 85-87%  Please briefly explain why patient needs home oxygen:

## 2019-06-26 DIAGNOSIS — U071 COVID-19: Principal | ICD-10-CM

## 2019-06-26 LAB — GLUCOSE, CAPILLARY
Glucose-Capillary: 225 mg/dL — ABNORMAL HIGH (ref 70–99)
Glucose-Capillary: 152 mg/dL — ABNORMAL HIGH (ref 70–99)
Glucose-Capillary: 252 mg/dL — ABNORMAL HIGH (ref 70–99)

## 2019-06-26 MED ORDER — PREDNISONE 10 MG PO TABS: ORAL_TABLET | ORAL | 0 refills | Status: DC

## 2019-06-26 MED ORDER — METOPROLOL TARTRATE 25 MG PO TABS: 25.0000 mg | ORAL_TABLET | Freq: Two times a day (BID) | ORAL | 0 refills | Status: DC

## 2019-06-26 MED ORDER — SALINE SPRAY 0.65 % NA SOLN: 1.0000 | NASAL | 0 refills | Status: AC | PRN

## 2019-06-26 MED ORDER — ALBUTEROL SULFATE HFA 108 (90 BASE) MCG/ACT IN AERS: 2.0000 | INHALATION_SPRAY | Freq: Four times a day (QID) | RESPIRATORY_TRACT | 0 refills | Status: AC | PRN

## 2019-06-26 MED ORDER — GUAIFENESIN-DM 100-10 MG/5ML PO SYRP: 10.0000 mL | ORAL_SOLUTION | ORAL | 0 refills | Status: AC | PRN

## 2019-06-26 MED FILL — VENTOLIN HFA 90 MCG INHALER: 108 (90 BAS | 18 days supply | Qty: 18 | Fill #0

## 2019-06-26 MED FILL — METOPROLOL TARTRATE 25 MG T: 25 | 30 days supply | Qty: 60 | Fill #0

## 2019-06-26 NOTE — Plan of Care (Signed)

## 2019-06-26 NOTE — Discharge Summary (Signed)
Physician Discharge Summary  Luther RedoRoberto Franco-Valdelamar ZOX:[id number] DOB: 1950-09-08 DOA: 06/19/2019  PCP: Patient, No Pcp Per  Admit date: 06/19/2019 Discharge date: 06/26/2019  Admitted From: Home Disposition: Home  Recommendations for Outpatient Follow-up:  1. Follow up with PCP in 1-2 weeks 2. Please obtain BMP/CBC in one week  Equipment/Devices: Supplemental oxygen  Discharge Condition: Stable CODE STATUS: Full Diet recommendation: As tolerated  Brief/Interim Summary: Luther RedoRoberto Franco-Valdelamar  is a 68 y.o. 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  Presented to [facility] ER where he was found to have acute hypoxic respiratory failure due to COVID-19 pneumonia.  He was placed on 5 L nasal cannula oxygen and sent to [facility].  Patient admitted as above with acute hypoxic respiratory failure in the setting of COVID-19 pneumonia.  Patient is a trip from [place] with worsening symptoms, requiring admission with nasal cannula up to 10L to maintain saturations in the 90s.  Patient now able to ambulate on 4 L nasal cannula with sats in the low 90s, able to tolerate room air at rest.  Given moderate improvement in patient's respiratory status patient is otherwise stable and agreeable for discharge home, will need to transport to his truck so he can drive back to [place]. Patient had transient episode of A. fib/flutter on initial EKG at admission but this resolved over the first 24 hours admission, no recurrent episodes of fib flutter or tachyarrhythmias during stay.  At this time patient stable and agreeable for discharge home, able to do remain off oxygen at rest, requiring up to 4 L nasal cannula with exertion but otherwise feels quite well, we recommend seeing his PCP within 1 to 2 weeks.  Patient will need to  continue to quarantine for 21 days after nasal swab.  If patient has worsening shortness of breath, chest pain, fatigue, weakness, malaise, leg swelling, or chest pain he should report to the emergency department immediately.  **Patient requesting an additional day of monitoring and oxygen given worsening symptoms this afternoon. His oxygen remains stable compared to this morning's findings but patient is having worsening cough and feels "unsteady" - will continue aggressive pulm toilet and plan for DC in the morning pending clinical symptoms.  Discharge Diagnoses:  Active Problems:   COVID-19 virus infection    Discharge Instructions  Discharge Instructions    Call MD for:  difficulty breathing, headache or visual disturbances   Complete by: As directed    Call MD for:  extreme fatigue   Complete by: As directed    Call MD for:  hives   Complete by: As directed    Call MD for:  persistant dizziness or light-headedness   Complete by: As directed    Call MD for:  persistant nausea and vomiting   Complete by: As directed    Call MD for:  severe uncontrolled pain   Complete by: As directed    Call MD for:  temperature >100.4   Complete by: As directed    Diet - low sodium heart healthy   Complete by: As directed    Increase activity slowly   Complete by: As directed      Allergies as of 06/26/2019   No Known Allergies     Medication List    TAKE these medications   albuterol 108 (90  Base) MCG/ACT inhaler Commonly known as: VENTOLIN HFA Inhale 2 puffs into the lungs every 6 (six) hours as needed for wheezing or shortness of breath.   guaiFENesin-dextromethorphan 100-10 MG/5ML syrup Commonly known as: ROBITUSSIN DM Take 10 mLs by mouth every 4 (four) hours as needed for cough.   metoprolol tartrate 25 MG tablet Commonly known as: LOPRESSOR Take 1 tablet (25 mg total) by mouth 2 (two) times daily.   predniSONE 10 MG tablet Commonly known as: DELTASONE Take 4 tablets  (40 mg total) by mouth daily for 3 days, THEN 3 tablets (30 mg total) daily for 3 days, THEN 2 tablets (20 mg total) daily for 3 days, THEN 1 tablet (10 mg total) daily for 3 days. Start taking on: June 26, 2019   sodium chloride 0.65 % Soln nasal spray Commonly known as: OCEAN Place 1 spray into both nostrils as needed for congestion.            Durable Medical Equipment  (From admission, onward)         Start     Ordered   06/26/19 1343  DME Oxygen  Once    Question Answer Comment  Length of Need 6 Months   Mode or (Route) Nasal cannula   Liters per Minute 4   Frequency Continuous (stationary and portable oxygen unit needed)   Oxygen conserving device No   Oxygen delivery system Gas      06/26/19 1342   06/26/19 1343  For home use only DME Pulse oximeter  Once     06/26/19 1342          No Known Allergies   Procedures/Studies: CT ANGIO CHEST PE W OR WO CONTRAST  Result Date: 06/19/2019 CLINICAL DATA:  Shortness of breath. Weakness. EXAM: CT ANGIOGRAPHY CHEST WITH CONTRAST TECHNIQUE: Multidetector CT imaging of the chest was performed using the standard protocol during bolus administration of intravenous contrast. Multiplanar CT image reconstructions and MIPs were obtained to evaluate the vascular anatomy. CONTRAST:  75mL OMNIPAQUE IOHEXOL 350 MG/ML SOLN COMPARISON:  Radiograph yesterday. FINDINGS: Cardiovascular: There are no filling defects within the pulmonary arteries to suggest pulmonary embolus. Mild aortic atherosclerosis. No aortic dissection. Mild multi chamber cardiomegaly. No pericardial effusion. Mediastinum/Nodes: No enlarged mediastinal or hilar lymph nodes. Small upper paratracheal nodes are subcentimeter. Decompressed esophagus. No thyroid nodule. Lungs/Pleura: Bilateral heterogeneous primarily ground-glass opacities throughout both lungs, peripheral and slight basilar predominant distribution. No pleural fluid. Trachea and mainstem bronchi are patent.  Upper Abdomen: No acute findings. Musculoskeletal: There are no acute or suspicious osseous abnormalities. Mild scoliosis and degenerative change in the spine. Review of the MIP images confirms the above findings. IMPRESSION: 1. No pulmonary embolus. 2. Bilateral heterogeneous primarily ground-glass opacities throughout both lungs, consistent with COVID-19 pneumonia. 3. Mild cardiomegaly. Aortic Atherosclerosis (ICD10-I70.0). Electronically Signed   By: Narda Rutherford M.D.   On: 06/19/2019 05:08   US Venous Img Lower Unilateral Right  Result Date: 06/18/2019 CLINICAL DATA:  Pain and swelling EXAM: RIGHT LOWER EXTREMITY VENOUS DOPPLER ULTRASOUND TECHNIQUE: Gray-scale sonography with graded compression, as well as color Doppler and duplex ultrasound were performed to evaluate the lower extremity deep venous systems from the level of the common femoral vein and including the common femoral, femoral, profunda femoral, popliteal and calf veins including the posterior tibial, peroneal and gastrocnemius veins when visible. The superficial great saphenous vein was also interrogated. Spectral Doppler was utilized to evaluate flow at rest and with distal augmentation maneuvers in the common femoral,  femoral and popliteal veins. COMPARISON:  None. FINDINGS: Contralateral Common Femoral Vein: Respiratory phasicity is normal and symmetric with the symptomatic side. No evidence of thrombus. Normal compressibility. Common Femoral Vein: No evidence of thrombus. Normal compressibility, respiratory phasicity and response to augmentation. Saphenofemoral Junction: No evidence of thrombus. Normal compressibility and flow on color Doppler imaging. Profunda Femoral Vein: No evidence of thrombus. Normal compressibility and flow on color Doppler imaging. Femoral Vein: No evidence of thrombus. Normal compressibility, respiratory phasicity and response to augmentation. Popliteal Vein: No evidence of thrombus. Normal compressibility,  respiratory phasicity and response to augmentation. Calf Veins: No evidence of thrombus. Normal compressibility and flow on color Doppler imaging. Superficial Great Saphenous Vein: No evidence of thrombus. Normal compressibility. Venous Reflux:  None. Other Findings:  None. IMPRESSION: No evidence of deep venous thrombosis. Electronically Signed   By: Constance Holster M.D.   On: 06/18/2019 23:10   DG Chest Port 1 View  Result Date: 06/18/2019 CLINICAL DATA:  Shortness of breath EXAM: PORTABLE CHEST 1 VIEW COMPARISON:  None. FINDINGS: Low lung volumes with bibasilar atelectasis. Cardiomegaly. No overt edema or effusions. No acute bony abnormality. IMPRESSION: Low lung volumes, bibasilar atelectasis. Electronically Signed   By: Rolm Baptise M.D.   On: 06/18/2019 21:37    Subjective: No acute issues or events overnight, patient denies chest pain, headache, fever, chills.  Patient does report minimal shortness of breath with exertion but otherwise feels quite well.   Discharge Exam: Vitals:   06/26/19 0730 06/26/19 0914  BP: 117/73 103/86  Pulse:  94  Resp: 20   Temp: 97.7 F (36.5 C)   SpO2: (!) 86% 98%   Vitals:   06/25/19 2000 06/26/19 0407 06/26/19 0730 06/26/19 0914  BP: 128/90 109/74 117/73 103/86  Pulse:  70  94  Resp: 19 20 20    Temp: 97.9 F (36.6 C) 98 F (36.7 C) 97.7 F (36.5 C)   TempSrc: Oral Oral Oral   SpO2: 90% 90% (!) 86% 98%  Weight:      Height:        General:  Pleasantly resting in bed, No acute distress. HEENT:  Normocephalic atraumatic.  Sclerae nonicteric, noninjected.  Extraocular movements intact bilaterally. Neck:  Without mass or deformity.  Trachea is midline. Lungs:  Clear to auscultate bilaterally without rhonchi, wheeze, or rales. Heart:  Regular rate and rhythm.  Without murmurs, rubs, or gallops. Abdomen:  Soft, nontender, nondistended.  Without guarding or rebound. Extremities: Without cyanosis, clubbing, edema, or obvious  deformity. Vascular:  Dorsalis pedis and posterior tibial pulses palpable bilaterally. Skin:  Warm and dry, no erythema, no ulcerations.   The results of significant diagnostics from this hospitalization (including imaging, microbiology, ancillary and laboratory) are listed below for reference.     Microbiology: Recent Results (from the past 240 hour(s))  Blood Culture (routine x 2)     Status: None   Collection Time: 06/18/19  9:05 PM   Specimen: BLOOD  Result Value Ref Range Status   Specimen Description BLOOD BLOOD RIGHT FOREARM  Final   Special Requests   Final    BOTTLES DRAWN AEROBIC AND ANAEROBIC Blood Culture adequate volume   Culture   Final    NO GROWTH 5 DAYS Performed at [facility], [address], [place],  [address]    Report Status 06/23/2019 FINAL  Final  Blood Culture (routine x 2)     Status: None   Collection Time: 06/18/19  9:28 PM   Specimen: BLOOD  Result Value Ref Range Status   Specimen Description BLOOD BLOOD LEFT ARM  Final   Special Requests   Final    BOTTLES DRAWN AEROBIC AND ANAEROBIC Blood Culture results may not be optimal due to an excessive volume of blood received in culture bottles   Culture   Final    NO GROWTH 5 DAYS Performed at [facility], [address] [place], [place] [address]    Report Status 06/23/2019 FINAL  Final     Labs: BNP (last 3 results) Recent Labs    06/20/19 0440 06/21/19 0510 06/22/19 0400  BNP 47.8 39.3 23.3   Basic Metabolic Panel: Recent Labs  Lab 06/20/19 0440 06/21/19 0510 06/22/19 0400  NA 140 139 139  K 3.6 3.5 3.6  CL 103 101 102  CO2 GLUCOSE 137* 150* 170*  BUN 26* 30* 33*  CREATININE 0.70 0.64 0.61  CALCIUM 8.6* 8.5* 8.4*  MG 2.2 2.2 2.3   Liver Function Tests: Recent Labs  Lab 06/20/19 0440 06/21/19 0510 06/22/19 0400  AST 36 36 48*  ALT 29 32 47*  ALKPHOS 62 66 58  BILITOT 0.7 0.5 1.0  PROT 6.6 6.7 6.4*  ALBUMIN 3.0* 3.1* 3.1*   No  results for input(s): LIPASE, AMYLASE in the last 168 hours. No results for input(s): AMMONIA in the last 168 hours. CBC: Recent Labs  Lab 06/20/19 0440 06/21/19 0510 06/22/19 0400  WBC 10.7* 9.9 12.0*  NEUTROABS 8.6* 8.4* 10.4*  HGB 13.2 13.2 12.8*  HCT 39.0 38.7* 37.8*  MCV 92.6 92.1 92.2  PLT 345 407* 398   Cardiac Enzymes: No results for input(s): CKTOTAL, CKMB, CKMBINDEX, TROPONINI in the last 168 hours. BNP: Invalid input(s): POCBNP CBG: Recent Labs  Lab 06/25/19 1123 06/25/19 1615 06/25/19 2037 06/26/19 0729 06/26/19 1111  GLUCAP 308* 235* 136* 152* 252*   D-Dimer No results for input(s): DDIMER in the last 72 hours. Hgb A1c No results for input(s): HGBA1C in the last 72 hours. Lipid Profile No results for input(s): CHOL, HDL, LDLCALC, TRIG, CHOLHDL, LDLDIRECT in the last 72 hours. Thyroid function studies No results for input(s): TSH, T4TOTAL, T3FREE, THYROIDAB in the last 72 hours.  Invalid input(s): FREET3 Anemia work up No results for input(s): VITAMINB12, FOLATE, FERRITIN, TIBC, IRON, RETICCTPCT in the last 72 hours. Urinalysis No results found for: COLORURINE, APPEARANCEUR, LABSPEC, PHURINE, GLUCOSEU, HGBUR, BILIRUBINUR, KETONESUR, PROTEINUR, UROBILINOGEN, NITRITE, LEUKOCYTESUR Sepsis Labs Invalid input(s): PROCALCITONIN,  WBC,  LACTICIDVEN Microbiology Recent Results (from the past 240 hour(s))  Blood Culture (routine x 2)     Status: None   Collection Time: 06/18/19  9:05 PM   Specimen: BLOOD  Result Value Ref Range Status   Specimen Description BLOOD BLOOD RIGHT FOREARM  Final   Special Requests   Final    BOTTLES DRAWN AEROBIC AND ANAEROBIC Blood Culture adequate volume   Culture   Final    NO GROWTH 5 DAYS Performed at [facility], [address] [place], [place] [address]    Report Status 06/23/2019 FINAL  Final  Blood Culture (routine x 2)     Status: None   Collection Time: 06/18/19  9:28 PM   Specimen: BLOOD  Result Value  Ref Range Status   Specimen Description BLOOD BLOOD LEFT ARM  Final   Special Requests   Final    BOTTLES DRAWN AEROBIC AND ANAEROBIC Blood Culture results may not be optimal due to an excessive volume of blood received in culture  bottles   Culture   Final    NO GROWTH 5 DAYS Performed at [facility], [address] [place] [place], [place] [address]    Report Status 06/23/2019 FINAL  Final     Time coordinating discharge: Over 30 minutes  SIGNED:   Azucena Fallen, DO [facility] 06/26/2019, 1:42 PM Pager   If 7PM-7AM, please contact night-coverage [email or web address] Password [email or web address]

## 2019-06-26 NOTE — TOC Progression Note (Signed)
Transition of Care [facility]) - Progression Note    Patient Details  Name: William Beard MRN: [id number] Date of Birth: 04/27/1951  Transition of Care [facility]) CM/SW Contact  Shade Flood, LCSW Phone Number: 06/26/2019, 2:14 PM  Clinical Narrative:     MD anticipating pt will be stable for dc 12/23. Pt continues to require O2 with ambulation. 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 Pt's truck is parked at [facility] currently so he will need to transport to [facility] via [place] at [employer] and then he will be driving his truck back home to [place].   Discussed situation with Magda Paganini at [facility] who states that they can assist. Magda Paganini would like patient to dc with one of the big portable tanks from the closet at [facility] and then Huey Romans will coordinate to meet up with patient at his truck to deliver additional tanks. Per Magda Paganini, she is going to speak with patient using an interpreter to coordinate all the logistics of this plan. Updated AC at [facility] of above.   Also, pt's medications have been ordered by MD through the [facility]. Have coordinated with TOC supervisor and TOC Pharmacist for [facility] and delivery of pt's medications to [employer] in the AM.   Will follow up in AM to further assist with dc needs.  Expected Discharge Plan: Home/Self Care Barriers to Discharge: Continued Medical Work up  Expected Discharge Plan and Services Expected Discharge Plan: Home/Self Care     Post Acute Care Choice: Durable Medical Equipment                   DME Arranged: Oxygen DME Agency: [facility] Date DME Agency Contacted: 06/26/19 Time DME Agency Contacted: 2725 Representative spoke with at DME Agency: Magda Paganini             Social Determinants of Health ([place]) Interventions    Readmission Risk Interventions No flowsheet data found.

## 2019-06-26 NOTE — Progress Notes (Signed)
Pt up to bathroom earlier, he tried to go with out his O2,[place] was [address] on his way to the [facility], and doing well, until he returned to the chair, and desated to the low 80's and 79, and became very SOB, he was placed on 2 L [place], and recovered in 5 to 10 minutes.

## 2019-06-26 NOTE — Progress Notes (Addendum)
Patient daughter called and updates provided. Opportunity given to asked questions.   Daughter had concerns about constipation- patient medicated with PRN dulcolax 5 mg. Last BM 12/16.  Oxygen tubing was also extended per daughter request.

## 2019-06-26 NOTE — Progress Notes (Signed)
In Pt rehab met with the Pt today, and felt that he would benefit with the new program, however there were barriers with the program not open at this time.  MD would reach out to Dr. Avon Gully. She was not sure how much O2 the Pt would need to get across the country to safely make it back to [place], she said she would look into it and try and figure that out for [place] as well and let CSW now - if that would be the plan for his discharge home tomorrow.

## 2019-06-26 NOTE — Progress Notes (Signed)
Physical Therapy Treatment Patient Details Name: William Beard MRN: [id number] DOB: 1951/04/22 Today's Date: 06/26/2019    History of Present Illness William Beard  is a 68 y.o. 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  Presented to [facility] ER where he was found to have acute hypoxic respiratory failure due to COVID-19 pneumonia.  He was placed on 5 L nasal cannula oxygen and sent to [facility]    PT Comments      The patient resting on RA, SPO2 (finger 87%), placed probe left ear w/93% reading  ON ra.. Ambulated x 80' ON  RA, SPO2 79%,  HR 107, coughing frequently. Placed on 4 L with SPO2 gradually rising to 88% to ambulate another 100'. Spo2 required > 4 minutes to return to 94% on 2 L. Patient had frequent strong coughing spells in which he had to stop ambulating to maintain balance.  Patient  Continue to require supplemental O2 to maintain SPO2 >88%. Patient clearly deconditioned. Encourage ambulation w/ staff when able.    No PT follow up     Equipment Recommendations  None recommended by PT    Recommendations for Other Services       Precautions / Restrictions Precautions Precaution Comments: SpO2 drop    Mobility  Bed Mobility Overal bed mobility: Independent                Transfers Overall transfer level: Independent                  Ambulation/Gait Ambulation/Gait assistance: Supervision Gait Distance (Feet): 280 Feet Assistive device: None;1 person hand held assist Gait Pattern/deviations: Step-through pattern Gait velocity: decr   General Gait Details: decreased, required steady assist x 2 when started coughing spell   Stairs             Wheelchair Mobility    Modified Rankin (Stroke Patients Only)       Balance Overall balance  assessment: No apparent balance deficits (not formally assessed)                                          Cognition Arousal/Alertness: Awake/alert Behavior During Therapy: WFL for tasks assessed/performed                                          Exercises      General Comments        Pertinent Vitals/Pain Pain Assessment: No/denies pain    Home Living                      Prior Function            PT Goals (current goals can now be found in the care plan section) Progress towards PT goals: Progressing toward goals    Frequency    Min 3X/week      PT Plan Current plan remains appropriate    Co-evaluation              AM-PAC PT "6 Clicks" Mobility   Outcome Measure  Help needed turning from your back  to your side while in a flat bed without using bedrails?: None Help needed moving from lying on your back to sitting on the side of a flat bed without using bedrails?: None Help needed moving to and from a bed to a chair (including a wheelchair)?: None Help needed standing up from a chair using your arms (e.g., wheelchair or bedside chair)?: None Help needed to walk in hospital room?: None Help needed climbing 3-5 steps with a railing? : A Little 6 Click Score: 23    End of Session Equipment Utilized During Treatment: Oxygen Activity Tolerance: Patient tolerated treatment well Patient left: in chair Nurse Communication: Mobility status PT Visit Diagnosis: Difficulty in walking, not elsewhere classified (R26.2)     Time: 1224-8250 PT Time Calculation (min) (ACUTE ONLY): 41 min  Charges:  $Gait Training: 38-52 mins                     Rochelle Pager [phone] Office [phone]    Claretha Cooper 06/26/2019, 8:30 AM

## 2019-06-27 LAB — GLUCOSE, CAPILLARY
Glucose-Capillary: 270 mg/dL — ABNORMAL HIGH (ref 70–99)
Glucose-Capillary: 201 mg/dL — ABNORMAL HIGH (ref 70–99)
Glucose-Capillary: 136 mg/dL — ABNORMAL HIGH (ref 70–99)
Glucose-Capillary: 202 mg/dL — ABNORMAL HIGH (ref 70–99)
Glucose-Capillary: 221 mg/dL — ABNORMAL HIGH (ref 70–99)

## 2019-06-27 MED ORDER — FUROSEMIDE 10 MG/ML IJ SOLN
40.0000 mg | Freq: Two times a day (BID) | INTRAMUSCULAR | Status: DC
  Administered 2019-06-27 – 2019-06-29 (×4): 40 mg via INTRAVENOUS
  Filled 2019-06-27 (×4): qty 4

## 2019-06-27 NOTE — Progress Notes (Addendum)
PROGRESS NOTE                                                                                                                                                                                                             Patient Demographics:    William Beard, is a 68 y.o. male, DOB - 1951/06/15, KWI:[id number]  Outpatient Primary MD for the patient is Patient, No Pcp Per   Admit date - 06/19/2019   LOS - 8  No chief complaint on file.      Brief Narrative: Patient is a 68 y.o. male with no significant past medical history-who presented with acute hypoxic respiratory failure in the setting of COVID-19 pneumonia.   Subjective:    William Beard today remains stable-he still continues to complain of severe shortness of breath when he ambulates.  He is otherwise stable when resting.   Assessment  & Plan :   Acute Hypoxic Resp Failure due to Covid 19 Viral pneumonia: Improved-remains stable on room air at rest.  However continues to have significant shortness of breath with severe desaturation down to the 70s with minimal ambulation.  Continue steroids-suspect steroids can be discontinued on 12/24-check CRP tomorrow morning.  Fever: afebrile  O2 requirements:  SpO2: 93 % O2 Flow Rate (L/min): 1 L/min   COVID-19 Labs: No results for input(s): DDIMER, FERRITIN, LDH, CRP in the last 72 hours.     Component Value Date/Time   BNP 23.3 06/22/2019 0400    No results for input(s): PROCALCITON in the last 168 hours.  No results found for: SARSCOV2NAA   COVID-19 Medications: Steroids: 12/14>> Remdesivir: 12/14>> 12/18  Other medications: Diuretics:Euvolemic-start IV Lasix-ensure negative balance. Antibiotics:Not needed as no evidence of bacterial infection Insulin: CBG stable on SSI-as patient on steroids.  A1c 6.4.  Needs diet/exercise on discharge.  Prone/Incentive Spirometry: encouraged   incentive spirometry use 3-4/hour.  DVT Prophylaxis  :  Lovenox   Transient episode of atrial fibrillation: Provoked due to acute illness from Covid.  Back in sinus rhythm-continue aspirin and metoprolol.  Obesity: Estimated body mass index is 31.57 kg/m as calculated from the following:   Height as of this encounter: 5\' 10"  (1.778 m).   Weight as of this encounter: 99.8 kg.   RN pressure injury documentation:  Consults  :  None  Procedures  :  None  ABG: No results found for: PHART, PCO2ART, PO2ART, HCO3, TCO2, ACIDBASEDEF, O2SAT  Vent Settings: N/  Condition - Stable  Family Communication  :    Code Status :  Full   Diet :  Diet Order            Diet - low sodium heart healthy        Diet 2 gram sodium Room service appropriate? Yes; Fluid consistency: Thin  Diet effective now               Disposition Plan  :  Remain hospitalized-  Barriers to discharge: Significant shortness of breath and desaturation with ambulation-not stable for discharge currently.  Antimicorbials  :    Anti-infectives (From admission, onward)   Start     Dose/Rate Route Frequency Ordered Stop   06/20/19 1000  remdesivir 100 mg in sodium chloride 0.9 % 100 mL IVPB  Status:  Discontinued     100 mg 200 mL/hr over 30 Minutes Intravenous Daily 06/19/19 1155 06/19/19 1205   06/19/19 2000  remdesivir 100 mg in sodium chloride 0.9 % 100 mL IVPB     100 mg 200 mL/hr over 30 Minutes Intravenous Daily 06/19/19 1206 06/22/19 1634   06/19/19 1200  remdesivir 200 mg in sodium chloride 0.9% 250 mL IVPB  Status:  Discontinued     200 mg 580 mL/hr over 30 Minutes Intravenous Once 06/19/19 1155 06/19/19 1205      Inpatient Medications  Scheduled Meds: . aspirin  81 mg Oral Daily  . enoxaparin (LOVENOX) injection  40 mg Subcutaneous Q24H  . insulin aspart  0-5 Units Subcutaneous QHS  . insulin aspart  0-9 Units Subcutaneous TID WC  . methylPREDNISolone (SOLU-MEDROL) injection  40 mg  Intravenous Q12H  . metoprolol tartrate  25 mg Oral BID   Continuous Infusions: PRN Meds:.acetaminophen, albuterol, alum & mag hydroxide-simeth, bisacodyl, chlorpheniramine-HYDROcodone, guaiFENesin-dextromethorphan, ondansetron **OR** ondansetron (ZOFRAN) IV, sodium chloride   Time Spent in minutes  25  See all Orders from today for further details   Jeoffrey Massed M.D on 06/27/2019 at 4:15 PM  To page go to [email or web address] - use universal password  [facility] -  Office  [phone]    Objective:   Vitals:   06/27/19 0500 06/27/19 0750 06/27/19 0757 06/27/19 1600  BP: 110/73 111/73  127/85  Pulse: 62 74  81  Resp: 17 18  18   Temp: 97.8 F (36.6 C) 97.9 F (36.6 C)  97.8 F (36.6 C)  TempSrc: Oral Oral  Oral  SpO2: 99% (!) 87% 95% 93%  Weight:      Height:        Wt Readings from Last 3 Encounters:  06/19/19 99.8 kg  06/18/19 99.8 kg     Intake/Output Summary (Last 24 hours) at 06/27/2019 1615 Last data filed at 06/27/2019 0830 Gross per 24 hour  Intake -  Output 750 ml  Net -750 ml     Physical Exam Gen Exam:Alert awake-not in any distress HEENT:atraumatic, normocephalic Chest: B/L clear to auscultation anteriorly CVS:S1S2 regular Abdomen:soft non tender, non distended Extremities:no edema Neurology: Non focal Skin: no rash   Data Review:    CBC Recent Labs  Lab 06/21/19 0510 06/22/19 0400  WBC 9.9 12.0*  HGB 13.2 12.8*  HCT 38.7* 37.8*  PLT 407* 398  MCV 92.1 92.2  MCH 31.4 31.2  MCHC 34.1 33.9  RDW 13.4 13.2  LYMPHSABS 0.7 0.7  MONOABS 0.7 0.8  EOSABS 0.0 0.0  BASOSABS 0.0  0.0    Chemistries  Recent Labs  Lab 06/21/19 0510 06/22/19 0400  NA 139 139  K 3.5 3.6  CL 101 102  CO2 25 27  GLUCOSE 150* 170*  BUN 30* 33*  CREATININE 0.64 0.61  CALCIUM 8.5* 8.4*  MG 2.2 2.3  AST 36 48*  ALT 32 47*  ALKPHOS 66 58  BILITOT 0.5 1.0    ------------------------------------------------------------------------------------------------------------------ No results for input(s): CHOL, HDL, LDLCALC, TRIG, CHOLHDL, LDLDIRECT in the last 72 hours.  Lab Results  Component Value Date   HGBA1C 6.4 (H) 06/19/2019   ------------------------------------------------------------------------------------------------------------------ No results for input(s): TSH, T4TOTAL, T3FREE, THYROIDAB in the last 72 hours.  Invalid input(s): FREET3 ------------------------------------------------------------------------------------------------------------------ No results for input(s): VITAMINB12, FOLATE, FERRITIN, TIBC, IRON, RETICCTPCT in the last 72 hours.  Coagulation profile No results for input(s): INR, PROTIME in the last 168 hours.  No results for input(s): DDIMER in the last 72 hours.  Cardiac Enzymes No results for input(s): CKMB, TROPONINI, MYOGLOBIN in the last 168 hours.  Invalid input(s): CK ------------------------------------------------------------------------------------------------------------------    Component Value Date/Time   BNP 23.3 06/22/2019 0400    Micro Results Recent Results (from the past 240 hour(s))  Blood Culture (routine x 2)     Status: None   Collection Time: 06/18/19  9:05 PM   Specimen: BLOOD  Result Value Ref Range Status   Specimen Description BLOOD BLOOD RIGHT FOREARM  Final   Special Requests   Final    BOTTLES DRAWN AEROBIC AND ANAEROBIC Blood Culture adequate volume   Culture   Final    NO GROWTH 5 DAYS Performed at [facility], [address] [place], [place] [address]    Report Status 06/23/2019 FINAL  Final  Blood Culture (routine x 2)     Status: None   Collection Time: 06/18/19  9:28 PM   Specimen: BLOOD  Result Value Ref Range Status   Specimen Description BLOOD BLOOD LEFT ARM  Final   Special Requests   Final    BOTTLES DRAWN AEROBIC AND ANAEROBIC Blood Culture  results may not be optimal due to an excessive volume of blood received in culture bottles   Culture   Final    NO GROWTH 5 DAYS Performed at [facility], [address] [place], [place] [address]    Report Status 06/23/2019 FINAL  Final    Radiology Reports CT ANGIO CHEST PE W OR WO CONTRAST  Result Date: 06/19/2019 CLINICAL DATA:  Shortness of breath. Weakness. EXAM: CT ANGIOGRAPHY CHEST WITH CONTRAST TECHNIQUE: Multidetector CT imaging of the chest was performed using the standard protocol during bolus administration of intravenous contrast. Multiplanar CT image reconstructions and MIPs were obtained to evaluate the vascular anatomy. CONTRAST:  75mL OMNIPAQUE IOHEXOL 350 MG/ML SOLN COMPARISON:  Radiograph yesterday. FINDINGS: Cardiovascular: There are no filling defects within the pulmonary arteries to suggest pulmonary embolus. Mild aortic atherosclerosis. No aortic dissection. Mild multi chamber cardiomegaly. No pericardial effusion. Mediastinum/Nodes: No enlarged mediastinal or hilar lymph nodes. Small upper paratracheal nodes are subcentimeter. Decompressed esophagus. No thyroid nodule. Lungs/Pleura: Bilateral heterogeneous primarily ground-glass opacities throughout both lungs, peripheral and slight basilar predominant distribution. No pleural fluid. Trachea and mainstem bronchi are patent. Upper Abdomen: No acute findings. Musculoskeletal: There are no acute or suspicious osseous abnormalities. Mild scoliosis and degenerative change in the spine. Review of the MIP images confirms the above findings. IMPRESSION: 1. No pulmonary embolus. 2. Bilateral heterogeneous primarily ground-glass opacities throughout both lungs, consistent with COVID-19 pneumonia. 3. Mild  cardiomegaly. Aortic Atherosclerosis (ICD10-I70.0). Electronically Signed   By: Narda RutherfordMelanie  Sanford M.D.   On: 06/19/2019 05:08   US Venous Img Lower Unilateral Right  Result Date: 06/18/2019 CLINICAL DATA:  Pain and swelling  EXAM: RIGHT LOWER EXTREMITY VENOUS DOPPLER ULTRASOUND TECHNIQUE: Gray-scale sonography with graded compression, as well as color Doppler and duplex ultrasound were performed to evaluate the lower extremity deep venous systems from the level of the common femoral vein and including the common femoral, femoral, profunda femoral, popliteal and calf veins including the posterior tibial, peroneal and gastrocnemius veins when visible. The superficial great saphenous vein was also interrogated. Spectral Doppler was utilized to evaluate flow at rest and with distal augmentation maneuvers in the common femoral, femoral and popliteal veins. COMPARISON:  None. FINDINGS: Contralateral Common Femoral Vein: Respiratory phasicity is normal and symmetric with the symptomatic side. No evidence of thrombus. Normal compressibility. Common Femoral Vein: No evidence of thrombus. Normal compressibility, respiratory phasicity and response to augmentation. Saphenofemoral Junction: No evidence of thrombus. Normal compressibility and flow on color Doppler imaging. Profunda Femoral Vein: No evidence of thrombus. Normal compressibility and flow on color Doppler imaging. Femoral Vein: No evidence of thrombus. Normal compressibility, respiratory phasicity and response to augmentation. Popliteal Vein: No evidence of thrombus. Normal compressibility, respiratory phasicity and response to augmentation. Calf Veins: No evidence of thrombus. Normal compressibility and flow on color Doppler imaging. Superficial Great Saphenous Vein: No evidence of thrombus. Normal compressibility. Venous Reflux:  None. Other Findings:  None. IMPRESSION: No evidence of deep venous thrombosis. Electronically Signed   By: Katherine Mantlehristopher  Green M.D.   On: 06/18/2019 23:10   DG Chest Port 1 View  Result Date: 06/18/2019 CLINICAL DATA:  Shortness of breath EXAM: PORTABLE CHEST 1 VIEW COMPARISON:  None. FINDINGS: Low lung volumes with bibasilar atelectasis. Cardiomegaly. No  overt edema or effusions. No acute bony abnormality. IMPRESSION: Low lung volumes, bibasilar atelectasis. Electronically Signed   By: Charlett NoseKevin  Dover M.D.   On: 06/18/2019 21:37

## 2019-06-27 NOTE — TOC Progression Note (Signed)
Transition of Care [facility]) - Progression Note    Patient Details  Name: Sutter Ahlgren MRN: [id number] Date of Birth: 05/10/1951  Transition of Care [facility]) CM/SW Contact  Shade Flood, LCSW Phone Number: 06/27/2019, 4:20 PM  Clinical Narrative:     Discussed pt status with MD. MD reports that pt is not stable for dc due to O2 needs when ambulating being too great. MD states pt will need CIR or SNF or to continue care at [facility] until he is well enough. If SNF is pursued, pt would need LOG as he does not have insurance.  TOC will follow.   Expected Discharge Plan: Home/Self Care Barriers to Discharge: Continued Medical Work up  Expected Discharge Plan and Services Expected Discharge Plan: Home/Self Care In-house Referral: Clinical Social Work Discharge Planning Services: [facility] Post Acute Care Choice: Durable Medical Equipment                   DME Arranged: Oxygen DME Agency: [place] Date DME Agency Contacted: 06/26/19 Time DME Agency Contacted: [phone] Representative spoke with at DME Agency: Magda Paganini             Social Determinants of Health (Tensed) Interventions    Readmission Risk Interventions No flowsheet data found.

## 2019-06-27 NOTE — Progress Notes (Addendum)
Spoke with daughter extensively on discharge plan based on SW notes. We did discuss the barriers for discharge to SNF/CIR and insurance. We also spoke about the idea of patient driving back to [place] and the potential risks involved.   Daughter states that patient does not understand at times our plan of care due to language barrier. States she will reach out to SW to f/u on 12/24.

## 2019-06-28 LAB — CBC
HCT: 43.5 % (ref 39.0–52.0)
Hemoglobin: 14.4 g/dL (ref 13.0–17.0)
MCH: 30.8 pg (ref 26.0–34.0)
MCHC: 33.1 g/dL (ref 30.0–36.0)
MCV: 92.9 fL (ref 80.0–100.0)
Platelets: 473 10*3/uL — ABNORMAL HIGH (ref 150–400)
RBC: 4.68 MIL/uL (ref 4.22–5.81)
RDW: 13.6 % (ref 11.5–15.5)
WBC: 18.8 10*3/uL — ABNORMAL HIGH (ref 4.0–10.5)
nRBC: 0 % (ref 0.0–0.2)

## 2019-06-28 LAB — COMPREHENSIVE METABOLIC PANEL
ALT: 42 U/L (ref 0–44)
AST: 25 U/L (ref 15–41)
Albumin: 3.1 g/dL — ABNORMAL LOW (ref 3.5–5.0)
Alkaline Phosphatase: 58 U/L (ref 38–126)
Anion gap: 11 (ref 5–15)
BUN: 37 mg/dL — ABNORMAL HIGH (ref 8–23)
CO2: 28 mmol/L (ref 22–32)
Calcium: 8.7 mg/dL — ABNORMAL LOW (ref 8.9–10.3)
Chloride: 96 mmol/L — ABNORMAL LOW (ref 98–111)
Creatinine, Ser: 0.85 mg/dL (ref 0.61–1.24)
GFR calc Af Amer: >60 mL/min (ref >60)
GFR calc non Af Amer: >60 mL/min (ref >60)
Glucose, Bld: 168 mg/dL — ABNORMAL HIGH (ref 70–99)
Potassium: 4.6 mmol/L (ref 3.5–5.1)
Sodium: 135 mmol/L (ref 135–145)
Total Bilirubin: 0.9 mg/dL (ref 0.3–1.2)
Total Protein: 6.4 g/dL — ABNORMAL LOW (ref 6.5–8.1)

## 2019-06-28 LAB — C-REACTIVE PROTEIN: CRP: <0.5 mg/dL (ref <1.0)

## 2019-06-28 LAB — GLUCOSE, CAPILLARY
Glucose-Capillary: 190 mg/dL — ABNORMAL HIGH (ref 70–99)
Glucose-Capillary: 140 mg/dL — ABNORMAL HIGH (ref 70–99)
Glucose-Capillary: 204 mg/dL — ABNORMAL HIGH (ref 70–99)
Glucose-Capillary: 163 mg/dL — ABNORMAL HIGH (ref 70–99)

## 2019-06-28 LAB — D-DIMER, QUANTITATIVE: D-Dimer, Quant: 0.43 ug/mL-FEU (ref 0.00–0.50)

## 2019-06-28 NOTE — Progress Notes (Signed)
Daughter updated on patient status and treatment /discharge plan.

## 2019-06-28 NOTE — Progress Notes (Addendum)
This RN and patient spoke with daughter Hollie Salk, updates provided and questions answered. Family is pleasant and supportive. Ipad translator services offered as pt is primarily spanish-speaking but declined, this rn and pt able to communicate effectively and daughter is fluent in [place] and aids in communicating any needs or concerns.

## 2019-06-28 NOTE — Progress Notes (Signed)
PT Cancellation Note  Patient Details Name: Natalie Leclaire MRN: [id number] DOB: [birth year]   Cancelled Treatment:    Reason Eval/Treat Not Completed: Other (comment)Patient had just ambulated w/ RN on # L. Will check back another time.   Claretha Cooper 06/28/2019, 4:32 PM [place] Pager [phone] Office [phone]

## 2019-06-28 NOTE — Consult Note (Signed)
Physical Medicine and Rehabilitation Consult Reason for Consult: Impaired mobility and ADLs Referring Physician: Dr. Holli Humbles   HPI: William Beard is a 68 y.o. 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 On admission, he was found to have acute hypoxic respiratory failure due to COVID-19 pneumonia and was placed on 5L [place] oxygen and sent to [facility].   He is here from [place]. Rehabilitation medicine was consulted regarding safe discharge plan.    ROS History reviewed. No pertinent past medical history. History reviewed. No pertinent surgical history. History reviewed. No pertinent family history. Social History:  reports that he has never smoked. He has never used smokeless tobacco. He reports previous alcohol use. He reports that he does not use drugs. Allergies: No Known Allergies No medications prior to admission.    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; very active PTA; walking; cycling Functional Status:  Mobility: Bed Mobility Overal bed mobility: Independent Transfers Overall transfer level: Independent Ambulation/Gait Ambulation/Gait assistance: Supervision Gait Distance (Feet): 280 Feet Assistive device: None, 1 person hand held assist Gait Pattern/deviations: Step-through pattern General Gait Details: decreased, required steady assist x 2 when started coughing spell Gait velocity: decr    ADL: ADL Overall ADL's : Needs assistance/impaired Functional mobility during ADLs: Modified independent General  ADL Comments: Pt able to complete ADL at standing level with 1/4 DOE adn desats to 80 on 2L. Educated through [place] interpreter on pursed lip breathing.   Cognition: Cognition Overall Cognitive Status: Within Functional Limits for tasks assessed Cognition Arousal/Alertness: Awake/alert Behavior During Therapy: WFL for tasks assessed/performed Overall Cognitive Status: Within Functional Limits for tasks assessed  Blood pressure 112/72, pulse 82, temperature 97.7 F (36.5 C), temperature source Oral, resp. rate 18, height 5\' 10"  (1.778 m), weight 99.8 kg, SpO2 90 %. Physical Exam  General: Alert and oriented x 3, No apparent distress HEENT: Head is normocephalic, atraumatic, PERRLA, EOMI, sclera anicteric, oral mucosa pink and moist, dentition intact, ext ear canals clear,  Neck: Supple without JVD or lymphadenopathy Heart: Reg rate and rhythm. No murmurs rubs or gallops Chest: CTA bilaterally without wheezes, rales, or rhonchi; no distress Abdomen: Soft, non-tender, non-distended, bowel sounds positive. Extremities: No clubbing, cyanosis, or edema. Pulses are 2+ Skin: Clean and intact without signs of breakdown Neuro: Pt is cognitively appropriate with normal insight, memory, and awareness. Cranial nerves 2-12 are intact. Sensory exam is normal. Reflexes are 2+ in all 4's. Fine motor coordination is intact. No tremors. Motor function is grossly 5/5.  Psych: Pt's affect is appropriate. Pt is cooperative   Results for orders placed or performed during the hospital encounter of 06/19/19 (from the past 24 hour(s))  Glucose, capillary     Status: Abnormal   Collection Time: 06/27/19  4:31 PM  Result Value Ref Range   Glucose-Capillary 221 (H) 70 - 99 mg/dL  Glucose, capillary     Status: Abnormal   Collection Time: 06/27/19  8:13 PM  Result Value Ref Range   Glucose-Capillary 190 (H) 70 - 99 mg/dL  Comprehensive metabolic panel     Status: Abnormal   Collection Time:  06/28/19  4:30 AM    Result Value Ref Range   Sodium 135 135 - 145 mmol/L   Potassium 4.6 3.5 - 5.1 mmol/L   Chloride 96 (L) 98 - 111 mmol/L   CO2 28 22 - 32 mmol/L   Glucose, Bld 168 (H) 70 - 99 mg/dL   BUN 37 (H) 8 - 23 mg/dL   Creatinine, Ser 3.22 0.61 - 1.24 mg/dL   Calcium 8.7 (L) 8.9 - 10.3 mg/dL   Total Protein 6.4 (L) 6.5 - 8.1 g/dL   Albumin 3.1 (L) 3.5 - 5.0 g/dL   AST 25 15 - 41 U/L   ALT 42 0 - 44 U/L   Alkaline Phosphatase 58 38 - 126 U/L   Total Bilirubin 0.9 0.3 - 1.2 mg/dL   GFR calc non Af Amer >60 >60 mL/min   GFR calc Af Amer >60 >60 mL/min   Anion gap 11 5 - 15  CRP     Status: None   Collection Time: 06/28/19  4:30 AM  Result Value Ref Range   CRP <0.5 <1.0 mg/dL  CBC     Status: Abnormal   Collection Time: 06/28/19  4:30 AM  Result Value Ref Range   WBC 18.8 (H) 4.0 - 10.5 K/uL   RBC 4.68 4.22 - 5.81 MIL/uL   Hemoglobin 14.4 13.0 - 17.0 g/dL   HCT 02.5 42.7 - 06.2 %   MCV 92.9 80.0 - 100.0 fL   MCH 30.8 26.0 - 34.0 pg   MCHC 33.1 30.0 - 36.0 g/dL   RDW 37.6 28.3 - 15.1 %   Platelets 473 (H) 150 - 400 K/uL   nRBC 0.0 0.0 - 0.2 %  D-dimer     Status: None   Collection Time: 06/28/19  4:30 AM  Result Value Ref Range   D-Dimer, Quant 0.43 0.00 - 0.50 ug/mL-FEU  Glucose, capillary     Status: Abnormal   Collection Time: 06/28/19  7:35 AM  Result Value Ref Range   Glucose-Capillary 140 (H) 70 - 99 mg/dL   No results found.  Assessment/Plan: Nicolaus Andel is a 68 y.o. male with no medical problems who works in [employer] as a Naval architect and had low grade fever and cough 4-5 days PTA, with SOB 2 days PTA. On admission, he was found to have acute hypoxic respiratory failure due to COVID-19 pneumonia and was placed on 5L [place] oxygen and sent to [facility].   Disposition: Since he is ambulating 280 feet he will be considered "too functional" for CIR at this time. Ideally, subacute rehabilitation where he could receive 1 hour of daily therapy while having having  oxygen monitoring available would be best for him. We would love to create such a unit at [facility] if we can obtain the approval to do so. In the meantime, I understand his desire to go home but it will be very difficult for him to drive from here to [place] when he is desaturating with ambulation to the bathroom. I am not sure how many tanks of oxygen he would need to take with him or if he would know how to operate the oxygen. Currently the best option for him would be discharge to SNF (as per SW, LOG since he is uninsured) until his saturations while ambulating have improved.  Case discussed with Luna Kitchens, RN; Elliot Gault SW; and Blanchard Kelch, PT.    Horton Chin, MD 06/28/2019

## 2019-06-28 NOTE — Progress Notes (Signed)
PROGRESS NOTE                                                                                                                                                                                                             Patient Demographics:    William Beard, is a 68 y.o. male, DOB - 1951-02-27, ZOX:[id number]  Outpatient Primary MD for the patient is Patient, No Pcp Per   Admit date - 06/19/2019   LOS - 9  No chief complaint on file.      Brief Narrative: Patient is a 68 y.o. male with no significant past medical history-who presented with acute hypoxic respiratory failure in the setting of COVID-19 pneumonia.   Subjective:    William Beard today remains stable-at room air he does not require oxygen.  But he does get short of breath with ambulating to the bathroom.   Assessment  & Plan :   Acute Hypoxic Resp Failure due to Covid 19 Viral pneumonia: Overall much improved-stable at rest-continues to have significant exertional dyspnea.  Will go ahead and stop steroids today.  Has completed a course of remdesivir.   Fever: afebrile  O2 requirements:  SpO2: 90 % O2 Flow Rate (L/min): 1 L/min   COVID-19 Labs: Recent Labs    06/28/19 0430  DDIMER 0.43  CRP <0.5       Component Value Date/Time   BNP 23.3 06/22/2019 0400    No results for input(s): PROCALCITON in the last 168 hours.  No results found for: SARSCOV2NAA   COVID-19 Medications: Steroids: 12/14>> Remdesivir: 12/14>> 12/18  Other medications: Diuretics:Euvolemic-continue IV Lasix times twice daily dosing for another day Antibiotics:Not needed as no evidence of bacterial infection Insulin: CBG stable on SSI-as patient on steroids.  A1c 6.4.  Needs diet/exercise on discharge.  Prone/Incentive Spirometry: encouraged  incentive spirometry use 3-4/hour.  DVT Prophylaxis  :  Lovenox   Transient episode of atrial  fibrillation: Provoked due to acute illness from Covid.  Back in sinus rhythm-continue aspirin and metoprolol.  Obesity: Estimated body mass index is 31.57 kg/m as calculated from the following:   Height as of this encounter: 5\' 10"  (1.778 m).   Weight as of this encounter: 99.8 kg.    Consults  :  None  Procedures  :  None  ABG: No results found for: PHART, PCO2ART,  PO2ART, HCO3, TCO2, ACIDBASEDEF, O2SAT  Vent Settings: N/  Condition - Stable  Family Communication  : Spoke with daughter-explained barriers for discharge-no family here-patient is thinking of driving his truck (18 wheeler) back to [employer] home O2.   Daughter will touch base with her siblings-she is thinking that one of the siblings can come here to pick this patient up over the next few days.  Code Status :  Full   Diet :  Diet Order            Diet - low sodium heart healthy        Diet 2 gram sodium Room service appropriate? Yes; Fluid consistency: Thin  Diet effective now               Disposition Plan  :  Remain hospitalized-  Barriers to discharge: See above-family communication.  Antimicorbials  :    Anti-infectives (From admission, onward)   Start     Dose/Rate Route Frequency Ordered Stop   06/20/19 1000  remdesivir 100 mg in sodium chloride 0.9 % 100 mL IVPB  Status:  Discontinued     100 mg 200 mL/hr over 30 Minutes Intravenous Daily 06/19/19 1155 06/19/19 1205   06/19/19 2000  remdesivir 100 mg in sodium chloride 0.9 % 100 mL IVPB     100 mg 200 mL/hr over 30 Minutes Intravenous Daily 06/19/19 1206 06/22/19 1634   06/19/19 1200  remdesivir 200 mg in sodium chloride 0.9% 250 mL IVPB  Status:  Discontinued     200 mg 580 mL/hr over 30 Minutes Intravenous Once 06/19/19 1155 06/19/19 1205      Inpatient Medications  Scheduled Meds: . aspirin  81 mg Oral Daily  . enoxaparin (LOVENOX) injection  40 mg Subcutaneous Q24H  . furosemide  40 mg Intravenous BID  . insulin aspart   0-5 Units Subcutaneous QHS  . insulin aspart  0-9 Units Subcutaneous TID WC  . methylPREDNISolone (SOLU-MEDROL) injection  40 mg Intravenous Q12H  . metoprolol tartrate  25 mg Oral BID   Continuous Infusions: PRN Meds:.acetaminophen, albuterol, alum & mag hydroxide-simeth, bisacodyl, chlorpheniramine-HYDROcodone, guaiFENesin-dextromethorphan, ondansetron **OR** ondansetron (ZOFRAN) IV, sodium chloride   Time Spent in minutes  25  See all Orders from today for further details   Jeoffrey Massed M.D on 06/28/2019 at 4:19 PM  To page go to [email or web address] - use universal password  [facility] -  Office  [phone]    Objective:   Vitals:   06/27/19 1600 06/27/19 2000 06/28/19 0400 06/28/19 0800  BP: 127/85 122/87 122/75 112/72  Pulse: 81 95 68 82  Resp: Temp: 97.8 F (36.6 C) 97.6 F (36.4 C) 97.8 F (36.6 C) 97.7 F (36.5 C)  TempSrc: Oral Oral Oral Oral  SpO2: 93% 92% 94% 90%  Weight:      Height:        Wt Readings from Last 3 Encounters:  06/19/19 99.8 kg  06/18/19 99.8 kg     Intake/Output Summary (Last 24 hours) at 06/28/2019 1619 Last data filed at 06/28/2019 1400 Gross per 24 hour  Intake 960 ml  Output 3400 ml  Net -2440 ml     Physical Exam Gen Exam:Alert awake-not in any distress HEENT:atraumatic, normocephalic Chest: B/L clear to auscultation anteriorly CVS:S1S2 regular Abdomen:soft non tender, non distended Extremities:no edema Neurology: Non focal Skin: no rash   Data Review:    CBC Recent Labs  Lab 06/22/19 0400 06/28/19 0430  WBC 12.0* 18.8*  HGB 12.8* 14.4  HCT 37.8* 43.5  PLT 398 473*  MCV 92.2 92.9  MCH 31.2 30.8  MCHC 33.9 33.1  RDW 13.2 13.6  LYMPHSABS 0.7  --   MONOABS 0.8  --   EOSABS 0.0  --   BASOSABS 0.0  --     Chemistries  Recent Labs  Lab 06/22/19 0400 06/28/19 0430  NA 139 135  K 3.6 4.6  CL 102 96*  CO2 27 28  GLUCOSE 170* 168*  BUN 33* 37*  CREATININE 0.61 0.85  CALCIUM  8.4* 8.7*  MG 2.3  --   AST 48* 25  ALT 47* 42  ALKPHOS 58 58  BILITOT 1.0 0.9   ------------------------------------------------------------------------------------------------------------------ No results for input(s): CHOL, HDL, LDLCALC, TRIG, CHOLHDL, LDLDIRECT in the last 72 hours.  Lab Results  Component Value Date   HGBA1C 6.4 (H) 06/19/2019   ------------------------------------------------------------------------------------------------------------------ No results for input(s): TSH, T4TOTAL, T3FREE, THYROIDAB in the last 72 hours.  Invalid input(s): FREET3 ------------------------------------------------------------------------------------------------------------------ No results for input(s): VITAMINB12, FOLATE, FERRITIN, TIBC, IRON, RETICCTPCT in the last 72 hours.  Coagulation profile No results for input(s): INR, PROTIME in the last 168 hours.  Recent Labs    06/28/19 0430  DDIMER 0.43    Cardiac Enzymes No results for input(s): CKMB, TROPONINI, MYOGLOBIN in the last 168 hours.  Invalid input(s): CK ------------------------------------------------------------------------------------------------------------------    Component Value Date/Time   BNP 23.3 06/22/2019 0400    Micro Results Recent Results (from the past 240 hour(s))  Blood Culture (routine x 2)     Status: None   Collection Time: 06/18/19  9:05 PM   Specimen: BLOOD  Result Value Ref Range Status   Specimen Description BLOOD BLOOD RIGHT FOREARM  Final   Special Requests   Final    BOTTLES DRAWN AEROBIC AND ANAEROBIC Blood Culture adequate volume   Culture   Final    NO GROWTH 5 DAYS Performed at [facility], [address] [place], [place] [address]    Report Status 06/23/2019 FINAL  Final  Blood Culture (routine x 2)     Status: None   Collection Time: 06/18/19  9:28 PM   Specimen: BLOOD  Result Value Ref Range Status   Specimen Description BLOOD BLOOD LEFT ARM  Final    Special Requests   Final    BOTTLES DRAWN AEROBIC AND ANAEROBIC Blood Culture results may not be optimal due to an excessive volume of blood received in culture bottles   Culture   Final    NO GROWTH 5 DAYS Performed at [facility], [address], [place], [place] [address]    Report Status 06/23/2019 FINAL  Final    Radiology Reports CT ANGIO CHEST PE W OR WO CONTRAST  Result Date: 06/19/2019 CLINICAL DATA:  Shortness of breath. Weakness. EXAM: CT ANGIOGRAPHY CHEST WITH CONTRAST TECHNIQUE: Multidetector CT imaging of the chest was performed using the standard protocol during bolus administration of intravenous contrast. Multiplanar CT image reconstructions and MIPs were obtained to evaluate the vascular anatomy. CONTRAST:  52mL OMNIPAQUE IOHEXOL 350 MG/ML SOLN COMPARISON:  Radiograph yesterday. FINDINGS: Cardiovascular: There are no filling defects within the pulmonary arteries to suggest pulmonary embolus. Mild aortic atherosclerosis. No aortic dissection. Mild multi chamber cardiomegaly. No pericardial effusion. Mediastinum/Nodes: No enlarged mediastinal or hilar lymph nodes. Small upper paratracheal nodes are subcentimeter. Decompressed esophagus. No thyroid nodule. Lungs/Pleura: Bilateral heterogeneous primarily ground-glass opacities throughout both lungs, peripheral and slight basilar predominant distribution. No pleural fluid. Trachea and mainstem  bronchi are patent. Upper Abdomen: No acute findings. Musculoskeletal: There are no acute or suspicious osseous abnormalities. Mild scoliosis and degenerative change in the spine. Review of the MIP images confirms the above findings. IMPRESSION: 1. No pulmonary embolus. 2. Bilateral heterogeneous primarily ground-glass opacities throughout both lungs, consistent with COVID-19 pneumonia. 3. Mild cardiomegaly. Aortic Atherosclerosis (ICD10-I70.0). Electronically Signed   By: Narda Rutherford M.D.   On: 06/19/2019 05:08   US Venous Img Lower  Unilateral Right  Result Date: 06/18/2019 CLINICAL DATA:  Pain and swelling EXAM: RIGHT LOWER EXTREMITY VENOUS DOPPLER ULTRASOUND TECHNIQUE: Gray-scale sonography with graded compression, as well as color Doppler and duplex ultrasound were performed to evaluate the lower extremity deep venous systems from the level of the common femoral vein and including the common femoral, femoral, profunda femoral, popliteal and calf veins including the posterior tibial, peroneal and gastrocnemius veins when visible. The superficial great saphenous vein was also interrogated. Spectral Doppler was utilized to evaluate flow at rest and with distal augmentation maneuvers in the common femoral, femoral and popliteal veins. COMPARISON:  None. FINDINGS: Contralateral Common Femoral Vein: Respiratory phasicity is normal and symmetric with the symptomatic side. No evidence of thrombus. Normal compressibility. Common Femoral Vein: No evidence of thrombus. Normal compressibility, respiratory phasicity and response to augmentation. Saphenofemoral Junction: No evidence of thrombus. Normal compressibility and flow on color Doppler imaging. Profunda Femoral Vein: No evidence of thrombus. Normal compressibility and flow on color Doppler imaging. Femoral Vein: No evidence of thrombus. Normal compressibility, respiratory phasicity and response to augmentation. Popliteal Vein: No evidence of thrombus. Normal compressibility, respiratory phasicity and response to augmentation. Calf Veins: No evidence of thrombus. Normal compressibility and flow on color Doppler imaging. Superficial Great Saphenous Vein: No evidence of thrombus. Normal compressibility. Venous Reflux:  None. Other Findings:  None. IMPRESSION: No evidence of deep venous thrombosis. Electronically Signed   By: Katherine Mantle M.D.   On: 06/18/2019 23:10   DG Chest Port 1 View  Result Date: 06/18/2019 CLINICAL DATA:  Shortness of breath EXAM: PORTABLE CHEST 1 VIEW COMPARISON:   None. FINDINGS: Low lung volumes with bibasilar atelectasis. Cardiomegaly. No overt edema or effusions. No acute bony abnormality. IMPRESSION: Low lung volumes, bibasilar atelectasis. Electronically Signed   By: Charlett Nose M.D.   On: 06/18/2019 21:37

## 2019-06-28 NOTE — Progress Notes (Signed)
SATURATION QUALIFICATIONS: (This note is used to comply with regulatory documentation for home oxygen)  Patient Saturations on Room Air at Rest = 93%  Patient Saturations on 3 Liters of oxygen while Ambulating = lowest 87%, highest 91%. Saturation stayed at 89% for most of the ambulation. No labored breathing. Highest heart rate seen 105 bpm.

## 2019-06-29 LAB — COMPREHENSIVE METABOLIC PANEL
ALT: 49 U/L — ABNORMAL HIGH (ref 0–44)
AST: 27 U/L (ref 15–41)
Albumin: 3.5 g/dL (ref 3.5–5.0)
Alkaline Phosphatase: 61 U/L (ref 38–126)
Anion gap: 19 — ABNORMAL HIGH (ref 5–15)
BUN: 43 mg/dL — ABNORMAL HIGH (ref 8–23)
CO2: 26 mmol/L (ref 22–32)
Calcium: 8.8 mg/dL — ABNORMAL LOW (ref 8.9–10.3)
Chloride: 92 mmol/L — ABNORMAL LOW (ref 98–111)
Creatinine, Ser: 0.91 mg/dL (ref 0.61–1.24)
GFR calc Af Amer: >60 mL/min (ref >60)
GFR calc non Af Amer: >60 mL/min (ref >60)
Glucose, Bld: 194 mg/dL — ABNORMAL HIGH (ref 70–99)
Potassium: 3.7 mmol/L (ref 3.5–5.1)
Sodium: 137 mmol/L (ref 135–145)
Total Bilirubin: 1.3 mg/dL — ABNORMAL HIGH (ref 0.3–1.2)
Total Protein: 7.1 g/dL (ref 6.5–8.1)

## 2019-06-29 LAB — GLUCOSE, CAPILLARY
Glucose-Capillary: 133 mg/dL — ABNORMAL HIGH (ref 70–99)
Glucose-Capillary: 197 mg/dL — ABNORMAL HIGH (ref 70–99)
Glucose-Capillary: 163 mg/dL — ABNORMAL HIGH (ref 70–99)
Glucose-Capillary: 131 mg/dL — ABNORMAL HIGH (ref 70–99)

## 2019-06-29 LAB — LACTIC ACID, PLASMA: Lactic Acid, Venous: 3 mmol/L (ref 0.5–1.9)

## 2019-06-29 NOTE — Progress Notes (Addendum)
   06/29/19 2202  Provider Notification  Provider Name/Title Durene Romans   Date Provider Notified 06/29/19  Time Provider Notified 2203  Notification Type Page  Notification Reason Other (Comment) (weak L pedal pulse )  Response No new orders  Date of Provider Response 06/29/19  Time of Provider Response 2208  weak L pedal pulse. Pt denies any pain. BLE cool to touch, cap refill <3secs, no cyanosis noted. Pt refused to dangle or ambulate this hs per recommendations, states he doesn't feel and ambulated today. sp02 90%s on RA at rest at this time but noted to desaturate to 70%s with ambulation on supplemental 02 for dayshift. Will continue to monitor per MD. Pt educated on VTE prevention/lovenox and ambulation- states understanding.

## 2019-06-29 NOTE — Progress Notes (Signed)
PROGRESS NOTE                                                                                                                                                                                                             Patient Demographics:    William Beard, is a 68 y.o. male, DOB - [birth year], [id number]  Outpatient Primary MD for the patient is Patient, No Pcp Per   Admit date - 06/19/2019   LOS - 10  No chief complaint on file.      Brief Narrative: Patient is a 68 y.o. male with no significant past medical history-who presented with acute hypoxic respiratory failure in the setting of COVID-19 pneumonia.   Subjective:    William Beard remains stable at rest-he continues to have episodes of significant desaturation/dizziness occasionally with minimal ambulation.   Assessment  & Plan :   Acute Hypoxic Resp Failure due to Covid 19 Viral pneumonia: Overall much improved-and stable at rest-continues to have episodes of significant exertional dyspnea (see nursing note today).  Continue supportive care.  Has completed a course of steroids and remdesivir.  Recent CTA chest and lower extremity Doppler negative for VTE.  Continue daily ambulation.  Fever: afebrile  O2 requirements:  SpO2: 96 % O2 Flow Rate (L/min): 0 L/min   COVID-19 Labs: Recent Labs    06/28/19 0430  DDIMER 0.43  CRP <0.5       Component Value Date/Time   BNP 23.3 06/22/2019 0400    No results for input(s): PROCALCITON in the last 168 hours.  No results found for: SARSCOV2NAA   COVID-19 Medications: Steroids: 12/14>> Remdesivir: 12/14>> 12/18  Other medications: Diuretics:Euvolemic-hold Lasix today.  Currently -4.1 L so far. Antibiotics:Not needed as no evidence of bacterial infection Insulin: CBG stable on SSI-as patient on steroids.  A1c 6.4.  Needs diet/exercise on discharge.  Prone/Incentive Spirometry:  encouraged  incentive spirometry use 3-4/hour.  DVT Prophylaxis  :  Lovenox   Transient episode of atrial fibrillation: Provoked due to acute illness from Covid.  Back in sinus rhythm-continue aspirin and metoprolol.  Obesity: Estimated body mass index is 31.57 kg/m as calculated from the following:   Height as of this encounter: 5\' 10"  (1.778 m).   Weight as of this encounter: 99.8 kg.    Consults  :  None  Procedures  :  None  ABG: No results found for: PHART, PCO2ART, PO2ART, HCO3, TCO2, ACIDBASEDEF, O2SAT  Vent Settings: N/  Condition - Stable  Family Communication  : Spoke with daughter on 12/24-explained barriers for discharge-no family here-patient is thinking of driving his truck (18 wheeler) back to [employer] home O2.  Daughter will touch base with her siblings-she is thinking that one of the siblings can come here to pick this patient up over the next few days.  Will update family on 12/26.  Code Status :  Full   Diet :  Diet Order            Diet - low sodium heart healthy        Diet 2 gram sodium Room service appropriate? Yes; Fluid consistency: Thin  Diet effective now               Disposition Plan  :  Remain hospitalized-  Barriers to discharge: See above-family communication.  Antimicorbials  :    Anti-infectives (From admission, onward)   Start     Dose/Rate Route Frequency Ordered Stop   06/20/19 1000  remdesivir 100 mg in sodium chloride 0.9 % 100 mL IVPB  Status:  Discontinued     100 mg 200 mL/hr over 30 Minutes Intravenous Daily 06/19/19 1155 06/19/19 1205   06/19/19 2000  remdesivir 100 mg in sodium chloride 0.9 % 100 mL IVPB     100 mg 200 mL/hr over 30 Minutes Intravenous Daily 06/19/19 1206 06/22/19 1634   06/19/19 1200  remdesivir 200 mg in sodium chloride 0.9% 250 mL IVPB  Status:  Discontinued     200 mg 580 mL/hr over 30 Minutes Intravenous Once 06/19/19 1155 06/19/19 1205      Inpatient Medications  Scheduled  Meds: . aspirin  81 mg Oral Daily  . enoxaparin (LOVENOX) injection  40 mg Subcutaneous Q24H  . furosemide  40 mg Intravenous BID  . insulin aspart  0-5 Units Subcutaneous QHS  . insulin aspart  0-9 Units Subcutaneous TID WC  . metoprolol tartrate  25 mg Oral BID   Continuous Infusions: PRN Meds:.acetaminophen, albuterol, alum & mag hydroxide-simeth, bisacodyl, chlorpheniramine-HYDROcodone, guaiFENesin-dextromethorphan, ondansetron **OR** ondansetron (ZOFRAN) IV, sodium chloride   Time Spent in minutes  25  See all Orders from today for further details   Oren Binet M.D on 06/29/2019 at 3:22 PM  To page go to [email or web address] - use universal password  [facility] -  Office  [phone]    Objective:   Vitals:   06/28/19 0800 06/28/19 2037 06/29/19 0445 06/29/19 0736  BP: 112/72 120/87 125/86 112/79  Pulse: 82 98 76 74  Resp: 18 (!) 22 (!) 22 20  Temp: 97.7 F (36.5 C) 98 F (36.7 C) 98 F (36.7 C) 97.9 F (36.6 C)  TempSrc: Oral  Oral Oral  SpO2: 90% 95% 95% 96%  Weight:      Height:        Wt Readings from Last 3 Encounters:  06/19/19 99.8 kg  06/18/19 99.8 kg     Intake/Output Summary (Last 24 hours) at 06/29/2019 1522 Last data filed at 06/29/2019 1106 Gross per 24 hour  Intake 360 ml  Output 2400 ml  Net -2040 ml     Physical Exam Gen Exam:Alert awake-not in any distress HEENT:atraumatic, normocephalic Chest: B/L clear to auscultation anteriorly CVS:S1S2 regular Abdomen:soft non tender, non distended Extremities:no edema Neurology: Non focal Skin: no rash   Data Review:    CBC Recent Labs  Lab  06/28/19 0430  WBC 18.8*  HGB 14.4  HCT 43.5  PLT 473*  MCV 92.9  MCH 30.8  MCHC 33.1  RDW 13.6    Chemistries  Recent Labs  Lab 06/28/19 0430 06/29/19 1002  NA 135 137  K 4.6 3.7  CL 96* 92*  CO2 28 26  GLUCOSE 168* 194*  BUN 37* 43*  CREATININE 0.85 0.91  CALCIUM 8.7* 8.8*  AST 25 27  ALT 42 49*  ALKPHOS 58 61   BILITOT 0.9 1.3*   ------------------------------------------------------------------------------------------------------------------ No results for input(s): CHOL, HDL, LDLCALC, TRIG, CHOLHDL, LDLDIRECT in the last 72 hours.  Lab Results  Component Value Date   HGBA1C 6.4 (H) 06/19/2019   ------------------------------------------------------------------------------------------------------------------ No results for input(s): TSH, T4TOTAL, T3FREE, THYROIDAB in the last 72 hours.  Invalid input(s): FREET3 ------------------------------------------------------------------------------------------------------------------ No results for input(s): VITAMINB12, FOLATE, FERRITIN, TIBC, IRON, RETICCTPCT in the last 72 hours.  Coagulation profile No results for input(s): INR, PROTIME in the last 168 hours.  Recent Labs    06/28/19 0430  DDIMER 0.43    Cardiac Enzymes No results for input(s): CKMB, TROPONINI, MYOGLOBIN in the last 168 hours.  Invalid input(s): CK ------------------------------------------------------------------------------------------------------------------    Component Value Date/Time   BNP 23.3 06/22/2019 0400    Micro Results No results found for this or any previous visit (from the past 240 hour(s)).  Radiology Reports CT ANGIO CHEST PE W OR WO CONTRAST  Result Date: 06/19/2019 CLINICAL DATA:  Shortness of breath. Weakness. EXAM: CT ANGIOGRAPHY CHEST WITH CONTRAST TECHNIQUE: Multidetector CT imaging of the chest was performed using the standard protocol during bolus administration of intravenous contrast. Multiplanar CT image reconstructions and MIPs were obtained to evaluate the vascular anatomy. CONTRAST:  75mL OMNIPAQUE IOHEXOL 350 MG/ML SOLN COMPARISON:  Radiograph yesterday. FINDINGS: Cardiovascular: There are no filling defects within the pulmonary arteries to suggest pulmonary embolus. Mild aortic atherosclerosis. No aortic dissection. Mild multi chamber  cardiomegaly. No pericardial effusion. Mediastinum/Nodes: No enlarged mediastinal or hilar lymph nodes. Small upper paratracheal nodes are subcentimeter. Decompressed esophagus. No thyroid nodule. Lungs/Pleura: Bilateral heterogeneous primarily ground-glass opacities throughout both lungs, peripheral and slight basilar predominant distribution. No pleural fluid. Trachea and mainstem bronchi are patent. Upper Abdomen: No acute findings. Musculoskeletal: There are no acute or suspicious osseous abnormalities. Mild scoliosis and degenerative change in the spine. Review of the MIP images confirms the above findings. IMPRESSION: 1. No pulmonary embolus. 2. Bilateral heterogeneous primarily ground-glass opacities throughout both lungs, consistent with COVID-19 pneumonia. 3. Mild cardiomegaly. Aortic Atherosclerosis (ICD10-I70.0). Electronically Signed   By: Narda RutherfordMelanie  Sanford M.D.   On: 06/19/2019 05:08   US Venous Img Lower Unilateral Right  Result Date: 06/18/2019 CLINICAL DATA:  Pain and swelling EXAM: RIGHT LOWER EXTREMITY VENOUS DOPPLER ULTRASOUND TECHNIQUE: Gray-scale sonography with graded compression, as well as color Doppler and duplex ultrasound were performed to evaluate the lower extremity deep venous systems from the level of the common femoral vein and including the common femoral, femoral, profunda femoral, popliteal and calf veins including the posterior tibial, peroneal and gastrocnemius veins when visible. The superficial great saphenous vein was also interrogated. Spectral Doppler was utilized to evaluate flow at rest and with distal augmentation maneuvers in the common femoral, femoral and popliteal veins. COMPARISON:  None. FINDINGS: Contralateral Common Femoral Vein: Respiratory phasicity is normal and symmetric with the symptomatic side. No evidence of thrombus. Normal compressibility. Common Femoral Vein: No evidence of thrombus. Normal compressibility, respiratory phasicity and response to  augmentation. Saphenofemoral Junction: No evidence of  thrombus. Normal compressibility and flow on color Doppler imaging. Profunda Femoral Vein: No evidence of thrombus. Normal compressibility and flow on color Doppler imaging. Femoral Vein: No evidence of thrombus. Normal compressibility, respiratory phasicity and response to augmentation. Popliteal Vein: No evidence of thrombus. Normal compressibility, respiratory phasicity and response to augmentation. Calf Veins: No evidence of thrombus. Normal compressibility and flow on color Doppler imaging. Superficial Great Saphenous Vein: No evidence of thrombus. Normal compressibility. Venous Reflux:  None. Other Findings:  None. IMPRESSION: No evidence of deep venous thrombosis. Electronically Signed   By: Katherine Mantle M.D.   On: 06/18/2019 23:10   DG Chest Port 1 View  Result Date: 06/18/2019 CLINICAL DATA:  Shortness of breath EXAM: PORTABLE CHEST 1 VIEW COMPARISON:  None. FINDINGS: Low lung volumes with bibasilar atelectasis. Cardiomegaly. No overt edema or effusions. No acute bony abnormality. IMPRESSION: Low lung volumes, bibasilar atelectasis. Electronically Signed   By: Charlett Nose M.D.   On: 06/18/2019 21:37

## 2019-06-29 NOTE — Progress Notes (Signed)
Daughter updated on current condition, treatment plan and discharge plan.

## 2019-06-29 NOTE — Progress Notes (Signed)
Attempted to get pt up to ambulate. While sitting on the side of the bed the patient became dizzy and O2 sat decreased to 81% while on 3lpm. Increased O2 to 6LPM and then 8LPM. It took the patient approximately 25 min to recover back to 92%. After patient recovered the oxygen was decreased back to 3LPM and then the patient walked to the bathroom while on 3lpm and de-sated to 75% and patient began to breath rapidly and became fatigued. Oxygen immediately increased to 8lpm and patient sat down in recliner. Patient currently resting comfortably, breathing is even and non-labored, and is asymptomatic on 5 lpm. Dr. Sloan Leiter made aware.

## 2019-06-29 NOTE — Plan of Care (Signed)
  Problem: Education: Goal: Knowledge of risk factors and measures for prevention of condition will improve Outcome: Progressing   Problem: Respiratory: Goal: Will maintain a patent airway Outcome: Progressing Note: Supplemental 02 with ambulation   Problem: Nutrition: Goal: Adequate nutrition will be maintained Outcome: Progressing   Problem: Coping: Goal: Level of anxiety will decrease Outcome: Progressing   Problem: Safety: Goal: Ability to remain free from injury will improve Outcome: Progressing   Problem: Skin Integrity: Goal: Risk for impaired skin integrity will decrease Outcome: Progressing

## 2019-06-30 ENCOUNTER — Inpatient Hospital Stay (HOSPITAL_COMMUNITY): Payer: Medicaid - Out of State

## 2019-06-30 LAB — COMPREHENSIVE METABOLIC PANEL
ALT: 47 U/L — ABNORMAL HIGH (ref 0–44)
AST: 34 U/L (ref 15–41)
Albumin: 3.1 g/dL — ABNORMAL LOW (ref 3.5–5.0)
Alkaline Phosphatase: 58 U/L (ref 38–126)
Anion gap: 14 (ref 5–15)
BUN: 46 mg/dL — ABNORMAL HIGH (ref 8–23)
CO2: 26 mmol/L (ref 22–32)
Calcium: 8.7 mg/dL — ABNORMAL LOW (ref 8.9–10.3)
Chloride: 95 mmol/L — ABNORMAL LOW (ref 98–111)
Creatinine, Ser: 0.88 mg/dL (ref 0.61–1.24)
GFR calc Af Amer: >60 mL/min (ref >60)
GFR calc non Af Amer: >60 mL/min (ref >60)
Glucose, Bld: 145 mg/dL — ABNORMAL HIGH (ref 70–99)
Potassium: 4.5 mmol/L (ref 3.5–5.1)
Sodium: 135 mmol/L (ref 135–145)
Total Bilirubin: 1.5 mg/dL — ABNORMAL HIGH (ref 0.3–1.2)
Total Protein: 6.3 g/dL — ABNORMAL LOW (ref 6.5–8.1)

## 2019-06-30 LAB — LACTIC ACID, PLASMA: Lactic Acid, Venous: 2.5 mmol/L (ref 0.5–1.9)

## 2019-06-30 LAB — GLUCOSE, CAPILLARY: Glucose-Capillary: 144 mg/dL — ABNORMAL HIGH (ref 70–99)

## 2019-06-30 LAB — D-DIMER, QUANTITATIVE: D-Dimer, Quant: 0.7 ug/mL-FEU — ABNORMAL HIGH (ref 0.00–0.50)

## 2019-06-30 MED ORDER — POLYETHYLENE GLYCOL 3350 17 G PO PACK
17.0000 g | PACK | Freq: Every day | ORAL | Status: DC
  Administered 2019-06-30 – 2019-07-02 (×3): 17 g via ORAL
  Filled 2019-06-30 (×5): qty 1

## 2019-06-30 MED ORDER — ALPRAZOLAM 0.25 MG PO TABS: 0.2500 mg | ORAL_TABLET | Freq: Three times a day (TID) | ORAL | Status: DC | PRN

## 2019-06-30 MED ORDER — SODIUM CHLORIDE 0.9 % IV SOLN: Freq: Once | INTRAVENOUS | Status: AC

## 2019-06-30 MED ORDER — FUROSEMIDE 10 MG/ML IJ SOLN
40.0000 mg | Freq: Every day | INTRAMUSCULAR | Status: DC
  Administered 2019-06-30 – 2019-07-01 (×2): 40 mg via INTRAVENOUS
  Filled 2019-06-30 (×2): qty 4

## 2019-06-30 NOTE — Plan of Care (Signed)
  Problem: Activity: Goal: Risk for activity intolerance will decrease Outcome: Progressing   Problem: Nutrition: Goal: Adequate nutrition will be maintained Outcome: Progressing   Problem: Safety: Goal: Ability to remain free from injury will improve Outcome: Progressing   Problem: Elimination: Goal: Will not experience complications related to bowel motility Outcome: Not Progressing   Problem: Pain Managment: Goal: General experience of comfort will improve Outcome: Completed/Met

## 2019-06-30 NOTE — Progress Notes (Signed)
Physical Therapy Treatment Patient Details Name: William Beard MRN: [id number] DOB: [birth year] Today's Date: 06/30/2019    History of Present Illness William Beard  is a 68 y.o. 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  Presented to [facility] ER where he was found to have acute hypoxic respiratory failure due to COVID-19 pneumonia.  He was placed on 5 L nasal cannula oxygen and sent to [facility]    PT Comments    The patient received resting on RA , 96% (ear monitor). After sitting up and donning briefs, patient required rest break, SPO2 on RA dropping to 90%. Patient rested x 4 minutes before initiating ambulation. Patient assisted  To door with HHA, then took a small WC and pushed it, SPO2 dropped to 86%, placed on  3 L [place] after 50' , SPO2 gradually rose to 94%. While ambulating x 110', much less than previously able to perform, patient stopped x 5 to rest. Patient can still benefit from CIR to return to independent level to travel to return to [place].  Placed back on RA with SPO2 back in low 90's. Continue progressive  Ambulation.   Follow Up Recommendations  CIR.  Equipment Recommendations  None recommended by PT    Recommendations for Other Services       Precautions / Restrictions Precautions Precaution Comments: SpO2 drop, requires rest breaks more now    Mobility  Bed Mobility Overal bed mobility: Independent                Transfers Overall transfer level: Needs assistance   Transfers: Sit to/from Stand Sit to Stand: Supervision            Ambulation/Gait Ambulation/Gait assistance: Min assist Gait Distance (Feet): 110 Feet Assistive device: (pushe WC as a rollator,) Gait Pattern/deviations: Step-through pattern Gait velocity: decr   General Gait Details:  decreased, required steady assist x 2 with baolance loss. Required 5 stops during short distance   Stairs             Wheelchair Mobility    Modified Rankin (Stroke Patients Only)       Balance Overall balance assessment: Needs assistance   Sitting balance-Leahy Scale: Normal       Standing balance-Leahy Scale: Poor Standing balance comment: today lost balance posterior x 2 while tanding.                            Cognition Arousal/Alertness: Awake/alert Behavior During Therapy: WFL for tasks assessed/performed                                   General Comments: expresses concern that he can ambulate as far without feeling SOB      Exercises      General Comments        Pertinent Vitals/Pain Pain Assessment: No/denies pain    Home Living                      Prior Function            PT Goals (current goals can now be found in the care plan section) Progress towards PT goals: Progressing toward goals  Frequency    Min 3X/week      PT Plan Current plan remains appropriate    Co-evaluation PT/OT/SLP Co-Evaluation/Treatment: Yes Reason for Co-Treatment: For patient/therapist safety PT goals addressed during session: Mobility/safety with mobility        AM-PAC PT "6 Clicks" Mobility   Outcome Measure  Help needed turning from your back to your side while in a flat bed without using bedrails?: None Help needed moving from lying on your back to sitting on the side of a flat bed without using bedrails?: None Help needed moving to and from a bed to a chair (including a wheelchair)?: None Help needed standing up from a chair using your arms (e.g., wheelchair or bedside chair)?: A Little Help needed to walk in hospital room?: A Little Help needed climbing 3-5 steps with a railing? : A Little 6 Click Score: 21    End of Session Equipment Utilized During Treatment: Oxygen Activity Tolerance: Patient  limited by fatigue Patient left: in chair;with call bell/phone within reach Nurse Communication: Mobility status PT Visit Diagnosis: Difficulty in walking, not elsewhere classified (R26.2)     Time: 2992-4268 PT Time Calculation (min) (ACUTE ONLY): 32 min  Charges:  $Gait Training: 8-22 mins                     Blanchard Kelch PT Acute Rehabilitation Services Pager [phone] Office [phone]    Rada Hay 06/30/2019, 4:03 PM

## 2019-06-30 NOTE — Progress Notes (Addendum)
Occupational Therapy Treatment Patient Details Name: William Beard MRN: [id number] DOB: [birth year] Today's Date: 06/30/2019    History of present illness William Beard  is a 68 y.o. 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  Presented to [facility] ER where he was found to have acute hypoxic respiratory failure due to COVID-19 pneumonia.  He was placed on 5 L nasal cannula oxygen and sent to [facility]   OT comments  Pt with gradual progress towards OT goals. He continues to have limitations due to fairly significant weakness, decreased activity tolerance. Pt tolerated room/hallway mobility today pushing transport chair for increased UE stability and close minguard-minA (+2 safety) as pt with x2 LOB requiring minA to correct. Pt requiring multiple standing rest breaks (at least 5) during mobility tasks due to fatigue. Pt completing LB ADL at [facility] assist level though required seated rest during task completion. Pt overall tolerating mobility initially on RA with SpO2 >/=86%, increased to 3L during activity with O2 maintaining >92%, max HR into the 130s. Given pt's slowed progress have updated discharge recommendations. Feel he will benefit from post acute rehab services and feel he is an excellent candidate for CIR level therapies at time of discharge. Will continue to follow acutely.   Follow Up Recommendations  CIR(pending progress)    Equipment Recommendations  None recommended by OT          Precautions / Restrictions Precautions Precautions: Fall Precaution Comments: SpO2 drop, requires rest breaks more now Restrictions Weight Bearing Restrictions: No       Mobility Bed Mobility Overal bed mobility: Modified Independent             General bed mobility comments: required increased  time to perform  Transfers Overall transfer level: Needs assistance Equipment used: None Transfers: Sit to/from Stand Sit to Stand: Supervision         General transfer comment: for balance and safety    Balance Overall balance assessment: Needs assistance   Sitting balance-Leahy Scale: Normal       Standing balance-Leahy Scale: Poor Standing balance comment: today lost balance posterior x 2 while standing.                           ADL either performed or assessed with clinical judgement   ADL Overall ADL's : Needs assistance/impaired                     Lower Body Dressing: Min guard;Sit to/from stand Lower Body Dressing Details (indicate cue type and reason):  donning men's briefs, minguard for balance with standing, increased time/effort and pt requiring seated rest break after threading LEs into briefs prior to standing to advance over hips              Functional mobility during ADLs: Min guard;Minimal assistance General ADL Comments: pt with decreased activity tolerance, weakness; requires multiple rest breaks during session     Vision       Perception     Praxis      Cognition Arousal/Alertness: Awake/alert Behavior During Therapy: [facility] for tasks assessed/performed  General Comments: expresses concern about decreased activity tolerance compared to previous sessions        Exercises Other Exercises Other Exercises: issued theraband and instructed in UB exercises   Shoulder Instructions       General Comments      Pertinent Vitals/ Pain       Pain Assessment: No/denies pain  Home Living                                          Prior Functioning/Environment              Frequency  Min 3X/week        Progress Toward Goals  OT Goals(current goals can now be found in the care plan section)  Progress towards OT goals: Progressing toward  goals  Acute Rehab OT Goals Patient Stated Goal: to get better OT Goal Formulation: With patient Time For Goal Achievement: 07/06/19 Potential to Achieve Goals: Good ADL Goals Additional ADL Goal #1: Pt will complete ADL with SpO2 remaining above 88 Additional ADL Goal #2: Pt will independently demonstrte pursed lip breathing techniques during ADL adn functional mobility for ADL  Plan Discharge plan needs to be updated    Co-evaluation    PT/OT/SLP Co-Evaluation/Treatment: Yes Reason for Co-Treatment: For patient/therapist safety PT goals addressed during session: Mobility/safety with mobility OT goals addressed during session: ADL's and self-care      AM-PAC OT "6 Clicks" Daily Activity     Outcome Measure   Help from another person eating meals?: None Help from another person taking care of personal grooming?: None Help from another person toileting, which includes using toliet, bedpan, or urinal?: A Little Help from another person bathing (including washing, rinsing, drying)?: A Little Help from another person to put on and taking off regular upper body clothing?: None Help from another person to put on and taking off regular lower body clothing?: A Little 6 Click Score: 21    End of Session Equipment Utilized During Treatment: Oxygen  OT Visit Diagnosis: Muscle weakness (generalized) (M62.81)   Activity Tolerance Patient tolerated treatment well   Patient Left in chair;with call bell/phone within reach   Nurse Communication Mobility status        Time: 1610-9604 OT Time Calculation (min): 27 min  Charges: OT General Charges $OT Visit: 1 Visit OT Treatments $Self Care/Home Management : 8-22 mins  William Beard, OT Supplemental Rehabilitation Services Pager [phone] Office [phone]    William Beard 06/30/2019, 4:36 PM

## 2019-06-30 NOTE — Plan of Care (Signed)
No acute changes this shift.   Problem: Education: Goal: Knowledge of risk factors and measures for prevention of condition will improve Outcome: Progressing   Problem: Coping: Goal: Psychosocial and spiritual needs will be supported Outcome: Progressing   Problem: Respiratory: Goal: Will maintain a patent airway Outcome: Progressing Goal: Complications related to the disease process, condition or treatment will be avoided or minimized Outcome: Progressing   Problem: Education: Goal: Knowledge of General Education information will improve Description: Including pain rating scale, medication(s)/side effects and non-pharmacologic comfort measures Outcome: Progressing   Problem: Health Behavior/Discharge Planning: Goal: Ability to manage health-related needs will improve Outcome: Progressing   Problem: Clinical Measurements: Goal: Ability to maintain clinical measurements within normal limits will improve Outcome: Progressing Goal: Will remain free from infection Outcome: Progressing Goal: Diagnostic test results will improve Outcome: Progressing Goal: Respiratory complications will improve Outcome: Progressing Goal: Cardiovascular complication will be avoided Outcome: Progressing   Problem: Activity: Goal: Risk for activity intolerance will decrease Outcome: Progressing   Problem: Nutrition: Goal: Adequate nutrition will be maintained Outcome: Progressing   Problem: Coping: Goal: Level of anxiety will decrease Outcome: Progressing   Problem: Elimination: Goal: Will not experience complications related to bowel motility Outcome: Progressing Goal: Will not experience complications related to urinary retention Outcome: Progressing   Problem: Pain Managment: Goal: General experience of comfort will improve Outcome: Progressing   Problem: Safety: Goal: Ability to remain free from injury will improve Outcome: Progressing   Problem: Skin Integrity: Goal: Risk  for impaired skin integrity will decrease Outcome: Progressing

## 2019-06-30 NOTE — Progress Notes (Signed)
PROGRESS NOTE                                                                                                                                                                                                             Patient Demographics:    William Beard, is a 68 y.o. male, DOB - 1951/01/03, ZOX:[id number]  Outpatient Primary MD for the patient is Patient, No Pcp Per   Admit date - 06/19/2019   LOS - 11  No chief complaint on file.      Brief Narrative: Patient is a 68 y.o. male with no significant past medical history-who presented with acute hypoxic respiratory failure in the setting of COVID-19 pneumonia.   Subjective:    William Beard remains stable at rest-I spoke to him with the help of the iPad translator.  Continue encouragement to get out of bed-continue to ambulate as much as possible.   Assessment  & Plan :   Acute Hypoxic Resp Failure due to Covid 19 Viral pneumonia: Remains stable at rest-but continues to complain of significant exertional dyspnea with ambulation.  Has completed a course of steroids/remdesivir.  Recent CTA chest/lower extremity Doppler negative for VTE.  No signs of volume overload on exam-continues to be on Lasix.  Have asked nursing staff to continue to ambulate patient is much as possible.  Fever: afebrile  O2 requirements:  SpO2: 94 % O2 Flow Rate (L/min): 0 L/min   COVID-19 Labs: Recent Labs    06/28/19 0430 06/30/19 0358  DDIMER 0.43 0.70*  CRP <0.5  --        Component Value Date/Time   BNP 23.3 06/22/2019 0400    No results for input(s): PROCALCITON in the last 168 hours.  No results found for: SARSCOV2NAA   COVID-19 Medications: Steroids: 12/14>> Remdesivir: 12/14>> 12/18  Other medications: Diuretics:Euvolemic-continue Lasix today, -5.6 L so far.   Antibiotics:Not needed as no evidence of bacterial infection Insulin: CBG stable on  SSI-as patient on steroids.  A1c 6.4.  Needs diet/exercise on discharge.  Prone/Incentive Spirometry: encouraged  incentive spirometry use 3-4/hour.  DVT Prophylaxis  :  Lovenox   Transient episode of atrial fibrillation: Provoked due to acute illness from Covid.  Back in sinus rhythm-continue aspirin and metoprolol.  Obesity: Estimated body mass index is 27.26 kg/m as calculated from the following:   Height  as of this encounter: 5\' 10"  (1.778 m).   Weight as of this encounter: 86.2 kg.    Consults  :  None  Procedures  :  None  ABG: No results found for: PHART, PCO2ART, PO2ART, HCO3, TCO2, ACIDBASEDEF, O2SAT  Vent Settings: N/  Condition - Stable  Family Communication  : Spoke with daughter over the phone  Code Status :  Full   Diet :  Diet Order            Diet - low sodium heart healthy        Diet 2 gram sodium Room service appropriate? Yes; Fluid consistency: Thin  Diet effective now               Disposition Plan  :  Remain hospitalized-unsafe discharge plan due to lack of family-patient from [place]-drives an 18 wheeler and still has severe exertional dyspnea  Barriers to discharge: Exertional dyspnea-requiring at times more than 5-6 L of oxygen.  No family in [place] drove a 109 wheeler to [place] from [place].  Antimicorbials  :    Anti-infectives (From admission, onward)   Start     Dose/Rate Route Frequency Ordered Stop   06/20/19 1000  remdesivir 100 mg in sodium chloride 0.9 % 100 mL IVPB  Status:  Discontinued     100 mg 200 mL/hr over 30 Minutes Intravenous Daily 06/19/19 1155 06/19/19 1205   06/19/19 2000  remdesivir 100 mg in sodium chloride 0.9 % 100 mL IVPB     100 mg 200 mL/hr over 30 Minutes Intravenous Daily 06/19/19 1206 06/22/19 1634   06/19/19 1200  remdesivir 200 mg in sodium chloride 0.9% 250 mL IVPB  Status:  Discontinued     200 mg 580 mL/hr over 30 Minutes Intravenous Once 06/19/19 1155 06/19/19 1205       Inpatient Medications  Scheduled Meds: . aspirin  81 mg Oral Daily  . enoxaparin (LOVENOX) injection  40 mg Subcutaneous Q24H  . furosemide  40 mg Intravenous Daily  . insulin aspart  0-5 Units Subcutaneous QHS  . insulin aspart  0-9 Units Subcutaneous TID WC  . metoprolol tartrate  25 mg Oral BID  . polyethylene glycol  17 g Oral Daily   Continuous Infusions: PRN Meds:.acetaminophen, albuterol, ALPRAZolam, alum & mag hydroxide-simeth, bisacodyl, chlorpheniramine-HYDROcodone, guaiFENesin-dextromethorphan, ondansetron **OR** ondansetron (ZOFRAN) IV, sodium chloride   Time Spent in minutes  25  See all Orders from today for further details   Oren Binet M.D on 06/30/2019 at 2:40 PM  To page go to [email or web address] - use universal password  [facility] -  Office  [phone]    Objective:   Vitals:   06/29/19 2039 06/30/19 0010 06/30/19 0420 06/30/19 0800  BP: 103/76 106/81 112/70 109/72  Pulse: 90 89 85 82  Resp: (!) 22 (!) 22 (!) 22 18  Temp: 98.1 F (36.7 C) 98.1 F (36.7 C) 97.8 F (36.6 C) 97.6 F (36.4 C)  TempSrc: Oral Oral Oral Oral  SpO2: 94% 97% 97% 94%  Weight:   86.2 kg   Height:        Wt Readings from Last 3 Encounters:  06/30/19 86.2 kg  06/18/19 99.8 kg     Intake/Output Summary (Last 24 hours) at 06/30/2019 1440 Last data filed at 06/30/2019 1045 Gross per 24 hour  Intake 345 ml  Output 1825 ml  Net -1480 ml     Physical Exam Gen Exam:Alert awake-not in any distress HEENT:atraumatic, normocephalic Chest: B/L clear to  auscultation anteriorly CVS:S1S2 regular Abdomen:soft non tender, non distended Extremities:no edema-both legs are warm to touch-pedal pulses are palpable. Neurology: Non focal Skin: no rash   Data Review:    CBC Recent Labs  Lab 06/28/19 0430  WBC 18.8*  HGB 14.4  HCT 43.5  PLT 473*  MCV 92.9  MCH 30.8  MCHC 33.1  RDW 13.6    Chemistries  Recent Labs  Lab 06/28/19 0430 06/29/19 1002  06/30/19 0358  NA 135 137 135  K 4.6 3.7 4.5  CL 96* 92* 95*  CO2 28 26 26   GLUCOSE 168* 194* 145*  BUN 37* 43* 46*  CREATININE 0.85 0.91 0.88  CALCIUM 8.7* 8.8* 8.7*  AST 25 27 34  ALT 42 49* 47*  ALKPHOS 58 61 58  BILITOT 0.9 1.3* 1.5*   ------------------------------------------------------------------------------------------------------------------ No results for input(s): CHOL, HDL, LDLCALC, TRIG, CHOLHDL, LDLDIRECT in the last 72 hours.  Lab Results  Component Value Date   HGBA1C 6.4 (H) 06/19/2019   ------------------------------------------------------------------------------------------------------------------ No results for input(s): TSH, T4TOTAL, T3FREE, THYROIDAB in the last 72 hours.  Invalid input(s): FREET3 ------------------------------------------------------------------------------------------------------------------ No results for input(s): VITAMINB12, FOLATE, FERRITIN, TIBC, IRON, RETICCTPCT in the last 72 hours.  Coagulation profile No results for input(s): INR, PROTIME in the last 168 hours.  Recent Labs    06/28/19 0430 06/30/19 0358  DDIMER 0.43 0.70*    Cardiac Enzymes No results for input(s): CKMB, TROPONINI, MYOGLOBIN in the last 168 hours.  Invalid input(s): CK ------------------------------------------------------------------------------------------------------------------    Component Value Date/Time   BNP 23.3 06/22/2019 0400    Micro Results No results found for this or any previous visit (from the past 240 hour(s)).  Radiology Reports DG Chest 1 View  Result Date: 06/30/2019 CLINICAL DATA:  Shortness of breath. EXAM: CHEST  1 VIEW COMPARISON:  06/18/2019 FINDINGS: Lungs are hypoinflated with mild patchy airspace opacification bilaterally left worse than right without significant change. Possible small amount left pleural fluid. Mild stable cardiomegaly. Remainder of the exam is unchanged. IMPRESSION: Hypoinflation with stable  bilateral patchy airspace process likely multifocal infection. Mild stable cardiomegaly. Electronically Signed   By: Elberta Fortisaniel  Boyle M.D.   On: 06/30/2019 04:07   CT ANGIO CHEST PE W OR WO CONTRAST  Result Date: 06/19/2019 CLINICAL DATA:  Shortness of breath. Weakness. EXAM: CT ANGIOGRAPHY CHEST WITH CONTRAST TECHNIQUE: Multidetector CT imaging of the chest was performed using the standard protocol during bolus administration of intravenous contrast. Multiplanar CT image reconstructions and MIPs were obtained to evaluate the vascular anatomy. CONTRAST:  75mL OMNIPAQUE IOHEXOL 350 MG/ML SOLN COMPARISON:  Radiograph yesterday. FINDINGS: Cardiovascular: There are no filling defects within the pulmonary arteries to suggest pulmonary embolus. Mild aortic atherosclerosis. No aortic dissection. Mild multi chamber cardiomegaly. No pericardial effusion. Mediastinum/Nodes: No enlarged mediastinal or hilar lymph nodes. Small upper paratracheal nodes are subcentimeter. Decompressed esophagus. No thyroid nodule. Lungs/Pleura: Bilateral heterogeneous primarily ground-glass opacities throughout both lungs, peripheral and slight basilar predominant distribution. No pleural fluid. Trachea and mainstem bronchi are patent. Upper Abdomen: No acute findings. Musculoskeletal: There are no acute or suspicious osseous abnormalities. Mild scoliosis and degenerative change in the spine. Review of the MIP images confirms the above findings. IMPRESSION: 1. No pulmonary embolus. 2. Bilateral heterogeneous primarily ground-glass opacities throughout both lungs, consistent with COVID-19 pneumonia. 3. Mild cardiomegaly. Aortic Atherosclerosis (ICD10-I70.0). Electronically Signed   By: Narda RutherfordMelanie  Sanford M.D.   On: 06/19/2019 05:08   US Venous Img Lower Unilateral Right  Result Date: 06/18/2019 CLINICAL DATA:  Pain  and swelling EXAM: RIGHT LOWER EXTREMITY VENOUS DOPPLER ULTRASOUND TECHNIQUE: Gray-scale sonography with graded compression, as  well as color Doppler and duplex ultrasound were performed to evaluate the lower extremity deep venous systems from the level of the common femoral vein and including the common femoral, femoral, profunda femoral, popliteal and calf veins including the posterior tibial, peroneal and gastrocnemius veins when visible. The superficial great saphenous vein was also interrogated. Spectral Doppler was utilized to evaluate flow at rest and with distal augmentation maneuvers in the common femoral, femoral and popliteal veins. COMPARISON:  None. FINDINGS: Contralateral Common Femoral Vein: Respiratory phasicity is normal and symmetric with the symptomatic side. No evidence of thrombus. Normal compressibility. Common Femoral Vein: No evidence of thrombus. Normal compressibility, respiratory phasicity and response to augmentation. Saphenofemoral Junction: No evidence of thrombus. Normal compressibility and flow on color Doppler imaging. Profunda Femoral Vein: No evidence of thrombus. Normal compressibility and flow on color Doppler imaging. Femoral Vein: No evidence of thrombus. Normal compressibility, respiratory phasicity and response to augmentation. Popliteal Vein: No evidence of thrombus. Normal compressibility, respiratory phasicity and response to augmentation. Calf Veins: No evidence of thrombus. Normal compressibility and flow on color Doppler imaging. Superficial Great Saphenous Vein: No evidence of thrombus. Normal compressibility. Venous Reflux:  None. Other Findings:  None. IMPRESSION: No evidence of deep venous thrombosis. Electronically Signed   By: Katherine Mantle M.D.   On: 06/18/2019 23:10   DG Chest Port 1 View  Result Date: 06/18/2019 CLINICAL DATA:  Shortness of breath EXAM: PORTABLE CHEST 1 VIEW COMPARISON:  None. FINDINGS: Low lung volumes with bibasilar atelectasis. Cardiomegaly. No overt edema or effusions. No acute bony abnormality. IMPRESSION: Low lung volumes, bibasilar atelectasis.  Electronically Signed   By: Charlett Nose M.D.   On: 06/18/2019 21:37

## 2019-06-30 NOTE — Plan of Care (Signed)
  Problem: Education: Goal: Knowledge of risk factors and measures for prevention of condition will improve Outcome: Progressing   Problem: Coping: Goal: Psychosocial and spiritual needs will be supported Outcome: Progressing   Problem: Pain Managment: Goal: General experience of comfort will improve Outcome: Progressing   Problem: Safety: Goal: Ability to remain free from injury will improve Outcome: Progressing   Problem: Skin Integrity: Goal: Risk for impaired skin integrity will decrease Outcome: Progressing   Problem: Respiratory: Goal: Complications related to the disease process, condition or treatment will be avoided or minimized Outcome: Not Progressing Note: Continues with desaturations with exertion   Problem: Clinical Measurements: Goal: Will remain free from infection Outcome: Not Progressing Note: Lactic 3.0

## 2019-07-01 LAB — CBC
HCT: 46.3 % (ref 39.0–52.0)
Hemoglobin: 15.5 g/dL (ref 13.0–17.0)
MCH: 31.1 pg (ref 26.0–34.0)
MCHC: 33.5 g/dL (ref 30.0–36.0)
MCV: 93 fL (ref 80.0–100.0)
Platelets: 450 10*3/uL — ABNORMAL HIGH (ref 150–400)
RBC: 4.98 MIL/uL (ref 4.22–5.81)
RDW: 13.8 % (ref 11.5–15.5)
WBC: 15.4 10*3/uL — ABNORMAL HIGH (ref 4.0–10.5)
nRBC: 0 % (ref 0.0–0.2)

## 2019-07-01 LAB — COMPREHENSIVE METABOLIC PANEL
ALT: 42 U/L (ref 0–44)
AST: 23 U/L (ref 15–41)
Albumin: 3 g/dL — ABNORMAL LOW (ref 3.5–5.0)
Alkaline Phosphatase: 59 U/L (ref 38–126)
Anion gap: 11 (ref 5–15)
BUN: 41 mg/dL — ABNORMAL HIGH (ref 8–23)
CO2: 29 mmol/L (ref 22–32)
Calcium: 8.6 mg/dL — ABNORMAL LOW (ref 8.9–10.3)
Chloride: 96 mmol/L — ABNORMAL LOW (ref 98–111)
Creatinine, Ser: 0.85 mg/dL (ref 0.61–1.24)
GFR calc Af Amer: >60 mL/min (ref >60)
GFR calc non Af Amer: >60 mL/min (ref >60)
Glucose, Bld: 125 mg/dL — ABNORMAL HIGH (ref 70–99)
Potassium: 3.7 mmol/L (ref 3.5–5.1)
Sodium: 136 mmol/L (ref 135–145)
Total Bilirubin: 1.2 mg/dL (ref 0.3–1.2)
Total Protein: 6.1 g/dL — ABNORMAL LOW (ref 6.5–8.1)

## 2019-07-01 LAB — D-DIMER, QUANTITATIVE: D-Dimer, Quant: 0.42 ug/mL-FEU (ref 0.00–0.50)

## 2019-07-01 LAB — GLUCOSE, CAPILLARY
Glucose-Capillary: 119 mg/dL — ABNORMAL HIGH (ref 70–99)
Glucose-Capillary: 156 mg/dL — ABNORMAL HIGH (ref 70–99)
Glucose-Capillary: 117 mg/dL — ABNORMAL HIGH (ref 70–99)

## 2019-07-01 NOTE — Progress Notes (Signed)
PROGRESS NOTE                                                                                                                                                                                                             Patient Demographics:    William Beard, is a 68 y.o. male, DOB - [birth year], OHY:[id number]  Outpatient Primary MD for the patient is Patient, No Pcp Per   Admit date - 06/19/2019   LOS - 12  No chief complaint on file.      Brief Narrative: Patient is a 68 y.o. male with no significant past medical history-who presented with acute hypoxic respiratory failure in the setting of COVID-19 pneumonia.   Subjective:    William Beard remains stable at rest.  Reviewed PT note-he still has significant exertional dyspnea and desaturation with minimal ambulation.   Assessment  & Plan :   Acute Hypoxic Resp Failure due to Covid 19 Viral pneumonia: Remains stable at rest-but continues to complain of significant exertional dyspnea with ambulation.  Has completed a course of steroids/remdesivir.  Recent CTA chest/lower extremity Doppler negative for VTE.  No signs of volume overload on exam-continues to be on Lasix.  Have asked nursing staff to continue to ambulate patient is much as possible.  Fever: afebrile  O2 requirements:  SpO2: 94 % O2 Flow Rate (L/min): 0 L/min   COVID-19 Labs: Recent Labs    06/30/19 0358 07/01/19 0046  DDIMER 0.70* 0.42       Component Value Date/Time   BNP 23.3 06/22/2019 0400    No results for input(s): PROCALCITON in the last 168 hours.  No results found for: SARSCOV2NAA   COVID-19 Medications: Steroids: 12/14>> Remdesivir: 12/14>> 12/18  Other medications: Diuretics:Euvolemic-continue Lasix today, -5.6 L so far.   Antibiotics:Not needed as no evidence of bacterial infection Insulin: CBG stable on SSI-as patient on steroids.  A1c 6.4.  Needs  diet/exercise on discharge.  Prone/Incentive Spirometry: encouraged  incentive spirometry use 3-4/hour.  DVT Prophylaxis  :  Lovenox   Transient episode of atrial fibrillation: Provoked due to acute illness from Covid.  Back in sinus rhythm-continue aspirin and metoprolol.  Obesity: Estimated body mass index is 27.26 kg/m as calculated from the following:   Height as of this encounter: 5\' 10"  (1.778 m).   Weight as of this encounter: 86.2  kg.    Consults  :  None  Procedures  :  None  ABG: No results found for: PHART, PCO2ART, PO2ART, HCO3, TCO2, ACIDBASEDEF, O2SAT  Vent Settings: N/  Condition - Stable  Family Communication  : Spoke with daughter over the phone on 12/26-we will update tomorrow.  Code Status :  Full   Diet :  Diet Order            Diet - low sodium heart healthy        Diet 2 gram sodium Room service appropriate? Yes; Fluid consistency: Thin  Diet effective now               Disposition Plan  :  Remain hospitalized-unsafe discharge plan due to lack of family-patient from [place]-drives an 18 wheeler and still has severe exertional dyspnea  Barriers to discharge: Exertional dyspnea-requiring at times more than 5-6 L of oxygen.  No family in [place] drove a 32 wheeler to [place] from [place].  Antimicorbials  :    Anti-infectives (From admission, onward)   Start     Dose/Rate Route Frequency Ordered Stop   06/20/19 1000  remdesivir 100 mg in sodium chloride 0.9 % 100 mL IVPB  Status:  Discontinued     100 mg 200 mL/hr over 30 Minutes Intravenous Daily 06/19/19 1155 06/19/19 1205   06/19/19 2000  remdesivir 100 mg in sodium chloride 0.9 % 100 mL IVPB     100 mg 200 mL/hr over 30 Minutes Intravenous Daily 06/19/19 1206 06/22/19 1634   06/19/19 1200  remdesivir 200 mg in sodium chloride 0.9% 250 mL IVPB  Status:  Discontinued     200 mg 580 mL/hr over 30 Minutes Intravenous Once 06/19/19 1155 06/19/19 1205      Inpatient  Medications  Scheduled Meds: . aspirin  81 mg Oral Daily  . enoxaparin (LOVENOX) injection  40 mg Subcutaneous Q24H  . furosemide  40 mg Intravenous Daily  . insulin aspart  0-5 Units Subcutaneous QHS  . insulin aspart  0-9 Units Subcutaneous TID WC  . metoprolol tartrate  25 mg Oral BID  . polyethylene glycol  17 g Oral Daily   Continuous Infusions: PRN Meds:.acetaminophen, albuterol, ALPRAZolam, alum & mag hydroxide-simeth, bisacodyl, chlorpheniramine-HYDROcodone, guaiFENesin-dextromethorphan, ondansetron **OR** ondansetron (ZOFRAN) IV, sodium chloride   Time Spent in minutes  25  See all Orders from today for further details   Oren Binet M.D on 07/01/2019 at 2:28 PM  To page go to [email or web address] - use universal password  [facility] -  Office  [phone]    Objective:   Vitals:   06/30/19 2115 07/01/19 0433 07/01/19 0546 07/01/19 0757  BP: 105/76 99/67 97/71  110/69  Pulse: 98 91 92 98  Resp: 20 (!) 22 20 16   Temp: 98.1 F (36.7 C) 97.7 F (36.5 C) 97.9 F (36.6 C) 98.1 F (36.7 C)  TempSrc: Oral Oral Oral Oral  SpO2: 96% 95% 96% 94%  Weight:      Height:        Wt Readings from Last 3 Encounters:  06/30/19 86.2 kg  06/18/19 99.8 kg     Intake/Output Summary (Last 24 hours) at 07/01/2019 1428 Last data filed at 07/01/2019 1150 Gross per 24 hour  Intake 330 ml  Output 1375 ml  Net -1045 ml     Physical Exam Gen Exam:Alert awake-not in any distress HEENT:atraumatic, normocephalic Chest: B/L clear to auscultation anteriorly CVS:S1S2 regular Abdomen:soft non tender, non distended Extremities:no edema-both legs are warm  to touch-pedal pulses are palpable. Neurology: Non focal Skin: no rash   Data Review:    CBC Recent Labs  Lab 06/28/19 0430 07/01/19 0046  WBC 18.8* 15.4*  HGB 14.4 15.5  HCT 43.5 46.3  PLT 473* 450*  MCV 92.9 93.0  MCH 30.8 31.1  MCHC 33.1 33.5  RDW 13.6 13.8    Chemistries  Recent Labs  Lab  06/28/19 0430 06/29/19 1002 06/30/19 0358 07/01/19 0046  NA 135 137 135 136  K 4.6 3.7 4.5 3.7  CL 96* 92* 95* 96*  CO2 28 26 26 29   GLUCOSE 168* 194* 145* 125*  BUN 37* 43* 46* 41*  CREATININE 0.85 0.91 0.88 0.85  CALCIUM 8.7* 8.8* 8.7* 8.6*  AST 25 27 34 23  ALT 42 49* 47* 42  ALKPHOS 58 61 58 59  BILITOT 0.9 1.3* 1.5* 1.2   ------------------------------------------------------------------------------------------------------------------ No results for input(s): CHOL, HDL, LDLCALC, TRIG, CHOLHDL, LDLDIRECT in the last 72 hours.  Lab Results  Component Value Date   HGBA1C 6.4 (H) 06/19/2019   ------------------------------------------------------------------------------------------------------------------ No results for input(s): TSH, T4TOTAL, T3FREE, THYROIDAB in the last 72 hours.  Invalid input(s): FREET3 ------------------------------------------------------------------------------------------------------------------ No results for input(s): VITAMINB12, FOLATE, FERRITIN, TIBC, IRON, RETICCTPCT in the last 72 hours.  Coagulation profile No results for input(s): INR, PROTIME in the last 168 hours.  Recent Labs    06/30/19 0358 07/01/19 0046  DDIMER 0.70* 0.42    Cardiac Enzymes No results for input(s): CKMB, TROPONINI, MYOGLOBIN in the last 168 hours.  Invalid input(s): CK ------------------------------------------------------------------------------------------------------------------    Component Value Date/Time   BNP 23.3 06/22/2019 0400    Micro Results No results found for this or any previous visit (from the past 240 hour(s)).  Radiology Reports DG Chest 1 View  Result Date: 06/30/2019 CLINICAL DATA:  Shortness of breath. EXAM: CHEST  1 VIEW COMPARISON:  06/18/2019 FINDINGS: Lungs are hypoinflated with mild patchy airspace opacification bilaterally left worse than right without significant change. Possible small amount left pleural fluid. Mild  stable cardiomegaly. Remainder of the exam is unchanged. IMPRESSION: Hypoinflation with stable bilateral patchy airspace process likely multifocal infection. Mild stable cardiomegaly. Electronically Signed   By: Elberta Fortisaniel  Boyle M.D.   On: 06/30/2019 04:07   CT ANGIO CHEST PE W OR WO CONTRAST  Result Date: 06/19/2019 CLINICAL DATA:  Shortness of breath. Weakness. EXAM: CT ANGIOGRAPHY CHEST WITH CONTRAST TECHNIQUE: Multidetector CT imaging of the chest was performed using the standard protocol during bolus administration of intravenous contrast. Multiplanar CT image reconstructions and MIPs were obtained to evaluate the vascular anatomy. CONTRAST:  75mL OMNIPAQUE IOHEXOL 350 MG/ML SOLN COMPARISON:  Radiograph yesterday. FINDINGS: Cardiovascular: There are no filling defects within the pulmonary arteries to suggest pulmonary embolus. Mild aortic atherosclerosis. No aortic dissection. Mild multi chamber cardiomegaly. No pericardial effusion. Mediastinum/Nodes: No enlarged mediastinal or hilar lymph nodes. Small upper paratracheal nodes are subcentimeter. Decompressed esophagus. No thyroid nodule. Lungs/Pleura: Bilateral heterogeneous primarily ground-glass opacities throughout both lungs, peripheral and slight basilar predominant distribution. No pleural fluid. Trachea and mainstem bronchi are patent. Upper Abdomen: No acute findings. Musculoskeletal: There are no acute or suspicious osseous abnormalities. Mild scoliosis and degenerative change in the spine. Review of the MIP images confirms the above findings. IMPRESSION: 1. No pulmonary embolus. 2. Bilateral heterogeneous primarily ground-glass opacities throughout both lungs, consistent with COVID-19 pneumonia. 3. Mild cardiomegaly. Aortic Atherosclerosis (ICD10-I70.0). Electronically Signed   By: Narda RutherfordMelanie  Sanford M.D.   On: 06/19/2019 05:08   US Venous Img Lower  Unilateral Right  Result Date: 06/18/2019 CLINICAL DATA:  Pain and swelling EXAM: RIGHT LOWER  EXTREMITY VENOUS DOPPLER ULTRASOUND TECHNIQUE: Gray-scale sonography with graded compression, as well as color Doppler and duplex ultrasound were performed to evaluate the lower extremity deep venous systems from the level of the common femoral vein and including the common femoral, femoral, profunda femoral, popliteal and calf veins including the posterior tibial, peroneal and gastrocnemius veins when visible. The superficial great saphenous vein was also interrogated. Spectral Doppler was utilized to evaluate flow at rest and with distal augmentation maneuvers in the common femoral, femoral and popliteal veins. COMPARISON:  None. FINDINGS: Contralateral Common Femoral Vein: Respiratory phasicity is normal and symmetric with the symptomatic side. No evidence of thrombus. Normal compressibility. Common Femoral Vein: No evidence of thrombus. Normal compressibility, respiratory phasicity and response to augmentation. Saphenofemoral Junction: No evidence of thrombus. Normal compressibility and flow on color Doppler imaging. Profunda Femoral Vein: No evidence of thrombus. Normal compressibility and flow on color Doppler imaging. Femoral Vein: No evidence of thrombus. Normal compressibility, respiratory phasicity and response to augmentation. Popliteal Vein: No evidence of thrombus. Normal compressibility, respiratory phasicity and response to augmentation. Calf Veins: No evidence of thrombus. Normal compressibility and flow on color Doppler imaging. Superficial Great Saphenous Vein: No evidence of thrombus. Normal compressibility. Venous Reflux:  None. Other Findings:  None. IMPRESSION: No evidence of deep venous thrombosis. Electronically Signed   By: Katherine Mantle M.D.   On: 06/18/2019 23:10   DG Chest Port 1 View  Result Date: 06/18/2019 CLINICAL DATA:  Shortness of breath EXAM: PORTABLE CHEST 1 VIEW COMPARISON:  None. FINDINGS: Low lung volumes with bibasilar atelectasis. Cardiomegaly. No overt edema or  effusions. No acute bony abnormality. IMPRESSION: Low lung volumes, bibasilar atelectasis. Electronically Signed   By: Charlett Nose M.D.   On: 06/18/2019 21:37

## 2019-07-02 LAB — COMPREHENSIVE METABOLIC PANEL
ALT: 42 U/L (ref 0–44)
AST: 25 U/L (ref 15–41)
Albumin: 3.1 g/dL — ABNORMAL LOW (ref 3.5–5.0)
Alkaline Phosphatase: 66 U/L (ref 38–126)
Anion gap: 11 (ref 5–15)
BUN: 40 mg/dL — ABNORMAL HIGH (ref 8–23)
CO2: 31 mmol/L (ref 22–32)
Calcium: 8.7 mg/dL — ABNORMAL LOW (ref 8.9–10.3)
Chloride: 93 mmol/L — ABNORMAL LOW (ref 98–111)
Creatinine, Ser: 0.99 mg/dL (ref 0.61–1.24)
GFR calc Af Amer: >60 mL/min (ref >60)
GFR calc non Af Amer: >60 mL/min (ref >60)
Glucose, Bld: 116 mg/dL — ABNORMAL HIGH (ref 70–99)
Potassium: 3.9 mmol/L (ref 3.5–5.1)
Sodium: 135 mmol/L (ref 135–145)
Total Bilirubin: 1.5 mg/dL — ABNORMAL HIGH (ref 0.3–1.2)
Total Protein: 6.4 g/dL — ABNORMAL LOW (ref 6.5–8.1)

## 2019-07-02 LAB — GLUCOSE, CAPILLARY
Glucose-Capillary: 257 mg/dL — ABNORMAL HIGH (ref 70–99)
Glucose-Capillary: 123 mg/dL — ABNORMAL HIGH (ref 70–99)
Glucose-Capillary: 163 mg/dL — ABNORMAL HIGH (ref 70–99)
Glucose-Capillary: 137 mg/dL — ABNORMAL HIGH (ref 70–99)
Glucose-Capillary: 190 mg/dL — ABNORMAL HIGH (ref 70–99)
Glucose-Capillary: 112 mg/dL — ABNORMAL HIGH (ref 70–99)
Glucose-Capillary: 176 mg/dL — ABNORMAL HIGH (ref 70–99)
Glucose-Capillary: 142 mg/dL — ABNORMAL HIGH (ref 70–99)

## 2019-07-02 MED ORDER — BISACODYL 5 MG PO TBEC: 5.0000 mg | DELAYED_RELEASE_TABLET | Freq: Every day | ORAL | Status: DC | PRN

## 2019-07-02 NOTE — Progress Notes (Signed)
   07/02/19 2549  Provider Notification  Provider Name/Title Durene Romans   Date Provider Notified 07/02/19  Time Provider Notified [phone]  Notification Type Page  Notification Reason Other (Comment) (mews yellow, hypotension)  Response No new orders  mews yellow, hypotension 80s/60s- 92/67 on recheck, asymptomatic.
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
## 2019-07-02 NOTE — Progress Notes (Signed)
Pt c/o constipation at this time. Pt had previously stated that he had a BM yesterday, but now says that it was very little and reports 4-5 days since last normal BM. Currently giving miralax daily. Paged MD requesting further intervention.

## 2019-07-02 NOTE — Progress Notes (Signed)
Occupational Therapy Treatment Patient Details Name: William Beard MRN: [id number] DOB: July 06, 1950 Today's Date: 07/02/2019    History of present illness Cadon Raczka  is a 68 y.o. 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  Presented to [facility] ER where he was found to have acute hypoxic respiratory failure due to COVID-19 pneumonia.  He was placed on 5 L nasal cannula oxygen and sent to [facility]   OT comments  Utilized interpreter services during session. Pt continues to make progress in therapy, requiring less supplemental oxygen compared to previous sessions. Pt able to ambulate to/from bathroom and complete toileting as well as grooming/hygiene tasks at the sink. Pt tolerated standing 1 x 8 min with supervision, noting 0 instances of loss of balance. Pt's SpO2 maintained in 90s throughout on room air with HR fluctuating between 110-130bpm. 2/4 DOE. Continued education with pt on pursed lip breathing strategies, energy conservation, and activity modifications. Requested Geomat cushion for pt's bedside chair as pt is unable to sit for longer than an hour due to sacral pain. RN updated. Recommend [place] OT for continued rehab following hospital discharge.   Follow Up Recommendations  Home health OT    Equipment Recommendations  3 in 1 bedside commode(for use in shower)    Recommendations for Other Services      Precautions / Restrictions Precautions Precautions: Fall Restrictions Weight Bearing Restrictions: No       Mobility Bed Mobility Overal bed mobility: Independent                Transfers Overall transfer level: Needs assistance Equipment used: None Transfers: Sit to/from Stand Sit to Stand: Supervision              Balance Overall balance assessment: No apparent  balance deficits (not formally assessed)                                         ADL either performed or assessed with clinical judgement   ADL Overall ADL's : Needs assistance/impaired     Grooming: Wash/dry hands;Wash/dry face;Oral care;Supervision/safety;Standing                   Toilet Transfer: Supervision/safety;Grab Haematologist;Ambulation   Toileting- Clothing Manipulation and Hygiene: Supervision/safety;Sit to/from stand       Functional mobility during ADLs: Supervision/safety General ADL Comments: Pt able to ambulate to/from bathroom with supervision, noting 0 instances of LOB.      Vision       Perception     Praxis      Cognition Arousal/Alertness: Awake/alert Behavior During Therapy: WFL for tasks assessed/performed Overall Cognitive Status: Within Functional Limits for tasks assessed                                          Exercises     Shoulder Instructions       General Comments Pt's SpO2 maintained in 90s throughout on room air. Pt reports SOB throughout with continued education provided on pursed lip breathing with fair understanding and follow through. Pt required min rest breaks throughout.  Pertinent Vitals/ Pain       Pain Assessment: No/denies pain  Home Living                                          Prior Functioning/Environment              Frequency           Progress Toward Goals  OT Goals(current goals can now be found in the care plan section)  Progress towards OT goals: Progressing toward goals  ADL Goals Additional ADL Goal #1: Pt will complete ADL with SpO2 remaining above 88 Additional ADL Goal #2: Pt will independently demonstrte pursed lip breathing techniques during ADL adn functional mobility for ADL  Plan Discharge plan remains appropriate    Co-evaluation                 AM-PAC OT "6 Clicks" Daily Activity     Outcome  Measure   Help from another person eating meals?: None Help from another person taking care of personal grooming?: A Little Help from another person toileting, which includes using toliet, bedpan, or urinal?: A Little Help from another person bathing (including washing, rinsing, drying)?: A Little Help from another person to put on and taking off regular upper body clothing?: None Help from another person to put on and taking off regular lower body clothing?: A Little 6 Click Score: 20    End of Session    OT Visit Diagnosis: Muscle weakness (generalized) (M62.81)   Activity Tolerance Patient tolerated treatment well   Patient Left in chair;with call bell/phone within reach   Nurse Communication Mobility status        Time: 7619-5093 OT Time Calculation (min): 50 min  Charges: OT General Charges $OT Visit: 1 Visit OT Treatments $Self Care/Home Management : 8-22 mins $Therapeutic Activity: 23-37 mins  Peterson Ao OTR/L [phone]    Peterson Ao 07/02/2019, 3:29 PM

## 2019-07-03 LAB — COMPREHENSIVE METABOLIC PANEL
ALT: 41 U/L (ref 0–44)
AST: 27 U/L (ref 15–41)
Albumin: 3.3 g/dL — ABNORMAL LOW (ref 3.5–5.0)
Alkaline Phosphatase: 66 U/L (ref 38–126)
Anion gap: 11 (ref 5–15)
BUN: 39 mg/dL — ABNORMAL HIGH (ref 8–23)
CO2: 29 mmol/L (ref 22–32)
Calcium: 8.3 mg/dL — ABNORMAL LOW (ref 8.9–10.3)
Chloride: 93 mmol/L — ABNORMAL LOW (ref 98–111)
Creatinine, Ser: 1.03 mg/dL (ref 0.61–1.24)
GFR calc Af Amer: >60 mL/min (ref >60)
GFR calc non Af Amer: >60 mL/min (ref >60)
Glucose, Bld: 109 mg/dL — ABNORMAL HIGH (ref 70–99)
Potassium: 3.5 mmol/L (ref 3.5–5.1)
Sodium: 133 mmol/L — ABNORMAL LOW (ref 135–145)
Total Bilirubin: 1.9 mg/dL — ABNORMAL HIGH (ref 0.3–1.2)
Total Protein: 6.4 g/dL — ABNORMAL LOW (ref 6.5–8.1)

## 2019-07-03 LAB — GLUCOSE, CAPILLARY
Glucose-Capillary: 197 mg/dL — ABNORMAL HIGH (ref 70–99)
Glucose-Capillary: 126 mg/dL — ABNORMAL HIGH (ref 70–99)
Glucose-Capillary: 152 mg/dL — ABNORMAL HIGH (ref 70–99)
Glucose-Capillary: 127 mg/dL — ABNORMAL HIGH (ref 70–99)

## 2019-07-03 NOTE — TOC Progression Note (Signed)
Transition of Care [facility]) - Progression Note    Patient Details  Name: Kamoni Gentles MRN: [id number] Date of Birth: [birth year]  Transition of Care [facility]) CM/SW Contact  Joaquin Courts, RN Phone Number: 07/03/2019, 4:20 PM  Clinical Narrative:    CM spoke with patient's daughter Nelva Bush who reports the family is working on figuring out who will be coming to [place] to pick up her dad.  After discussion Nelva Bush states that there may not be anyone available to pick him up until the weekend.  CM explained that patient is medically stable for a discharge and we will need to make accomodation arrangements for him to leave the hospital.  CM speak with daughter about finding lodging for her dad at one of the local hotels.  Daughter was provided with address to the hospital to aid in her search for nearby lodging.  Additionally, CM discussed the need for Oxygen at discharge. Of note, patient is uninsured and the plan per the daughter is to have a family member fly to [facility] and then drive the patient to [place] to stay with his daughter (address in [place] is [address], [place]) .  CM reached out to local DME agencies to see which agency has services locally to provide O2 while the patient is in the hotel awaiting his family and provide O2 to patient once he is in [place].  [facility] is able to service patient locally and in [place], however while the local agency can provide charity O2 services at no cost to patient, the [place] agency does not and will need to have a credit card on file that can be charged when patient arrives in [place].  CM relayed this information to his daughter.  The cost of services is 186.78$ per month for a stationary concentrator in the home, 30.57$ per month for a portable tank, cart, and regulator, and 15.33$ per month per refill tank.  This information was provided to the daughter and she was informed that the [place] branch is requesting to have a credit card on file  before they can provide services for self-pay patient.  Local [employer] rep Learta Codding did state that the patient can take the concentrator that will be provided here with him to [place] and the local agency will transfer ownership of the concentrator to the [place] branch once he arrives at his daughters home which can help with eliminating the need to coordinate set-up in the [place] home.  Daughter also reports she has looked up some local hotels and plans to rent a room for her dad to stay until someone arrives to pick him up.  Daughter requests additional time to complete this process and make final decisions.    CM made a follow-up phone call to see if daughter is ready to finalize dc and oxygen plans, but could not reach her by phone.         Expected Discharge Plan: Home/Self Care Barriers to Discharge: Continued Medical Work up  Expected Discharge Plan and Services Expected Discharge Plan: Home/Self Care In-house Referral: Clinical Social Work Discharge Planning Services: [facility] Post Acute Care Choice: Durable Medical Equipment                   DME Arranged: Oxygen DME Agency: [facility] Date DME Agency Contacted: 06/26/19 Time DME Agency Contacted: 2725 Representative spoke with at DME Agency: Magda Paganini             Social Determinants of  Health (SDOH) Interventions    Readmission Risk Interventions No flowsheet data found.

## 2019-07-03 NOTE — Progress Notes (Signed)
PROGRESS NOTE                                                                                                                                                                                                             Patient Demographics:    William Beard, is a 68 y.o. male, DOB - 1950-10-28, RSW:[id number]  Outpatient Primary MD for the patient is Patient, No Pcp Per   Admit date - 06/19/2019   LOS - 14  No chief complaint on file.      Brief Narrative: Patient is a 68 y.o. male with no significant past medical history-who presented with acute hypoxic respiratory failure in the setting of COVID-19 pneumonia.  Patient responded well to steroids and remdesivir-however continued to have significant desaturation and dyspnea with exertion fine up to 6-8 L of oxygen.  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  Although PT recommending CIR-he is not a CIR candidate per social work.  Disposition is difficult-given that there is no family support here-and lack of insurance-thankfully he is now slowly improving, family is in the process of arranging a family member to pick him up.  Case management is working on getting him set up with home O2.  If there is a delay in family member speaking this patient-family is agreeable for him to be discharged to a local hotel-we will his family arrives in town.   Subjective:    William Beard feels much better-he does not require oxygen at rest.  Per nursing staff-he has improved-and is only now for the first time today requiring just 2 L with ambulation.   Assessment  & Plan :   Acute Hypoxic Resp Failure due to Covid 19 Viral pneumonia: Improving-he now has less debility-and less exertional dyspnea-Per nursing report-he was able for the first time to  just tolerate 2 L of oxygen with ambulation.  Recent CTA chest/lower extremity Doppler was negative.  Spoke with case management-they are in the process of trying to see if they can arrange for home O2 (uninsured).  Spoke with daughter-family is trying to arrange-who is trying to arrange for a sibling to come and pick the patient up.They are aware that if the patient continues to improve,  he may be discharged, family will arrange a hotel for him, in that case.   Fever: afebrile  O2 requirements:  SpO2: 94 % O2 Flow Rate (L/min): 0 L/min   COVID-19 Labs: Recent Labs    07/01/19 0046  DDIMER 0.42       Component Value Date/Time   BNP 23.3 06/22/2019 0400    No results for input(s): PROCALCITON in the last 168 hours.  No results found for: SARSCOV2NAA   COVID-19 Medications: Steroids: 12/14>> Remdesivir: 12/14>> 12/18  Other medications: Diuretics:Euvolemic- stop Lasix today as BP on the lower side.  -7 L so far. Antibiotics:Not needed as no evidence of bacterial infection Insulin: CBG stable on SSI-as patient on steroids.  A1c 6.4.  Needs diet/exercise on discharge.  Prone/Incentive Spirometry: encouraged  incentive spirometry use 3-4/hour.  DVT Prophylaxis  :  Lovenox   Transient episode of atrial fibrillation: Provoked due to acute illness from Covid.  Back in sinus rhythm-continue aspirin and metoprolol.  Obesity: Estimated body mass index is 27.26 kg/m as calculated from the following:   Height as of this encounter: 5\' 10"  (1.778 m).   Weight as of this encounter: 86.2 kg.    Consults  :  None  Procedures  :  None  ABG: No results found for: PHART, PCO2ART, PO2ART, HCO3, TCO2, ACIDBASEDEF, O2SAT  Vent Settings: N/  Condition - Stable  Family Communication  : Spoke with daughter over the phone on 12/29  Code Status :  Full   Diet :  Diet Order            Diet - low sodium heart healthy        Diet 2 gram sodium Room service appropriate? Yes; Fluid  consistency: Thin  Diet effective now               Disposition Plan  :  Remain hospitalized-unsafe discharge plan due to lack of family-patient from [place]-drives an 18 wheeler and still has severe exertional dyspnea  Barriers to discharge: see above  Antimicorbials  :    Anti-infectives (From admission, onward)   Start     Dose/Rate Route Frequency Ordered Stop   06/20/19 1000  remdesivir 100 mg in sodium chloride 0.9 % 100 mL IVPB  Status:  Discontinued     100 mg 200 mL/hr over 30 Minutes Intravenous Daily 06/19/19 1155 06/19/19 1205   06/19/19 2000  remdesivir 100 mg in sodium chloride 0.9 % 100 mL IVPB     100 mg 200 mL/hr over 30 Minutes Intravenous Daily 06/19/19 1206 06/22/19 1634   06/19/19 1200  remdesivir 200 mg in sodium chloride 0.9% 250 mL IVPB  Status:  Discontinued     200 mg 580 mL/hr over 30 Minutes Intravenous Once 06/19/19 1155 06/19/19 1205      Inpatient Medications  Scheduled Meds: . aspirin  81 mg Oral Daily  . enoxaparin (LOVENOX) injection  40 mg Subcutaneous Q24H  . insulin aspart  0-5 Units Subcutaneous QHS  . insulin aspart  0-9 Units Subcutaneous TID WC  . metoprolol tartrate  25 mg Oral BID  . polyethylene glycol  17 g Oral Daily   Continuous Infusions: PRN Meds:.acetaminophen, albuterol, ALPRAZolam, alum & mag hydroxide-simeth, bisacodyl, chlorpheniramine-HYDROcodone, guaiFENesin-dextromethorphan, ondansetron **OR** ondansetron (ZOFRAN) IV, sodium chloride   Time Spent in minutes  25  See all Orders from today for further details   06/21/19 M.D on 07/03/2019 at 2:27 PM  To page go to [email or web address] - use universal password  [facility] -  Office  [phone]    Objective:   Vitals:   07/02/19 1815 07/02/19 2036 07/03/19 0325 07/03/19 0817  BP: 104/81 97/77 105/71 100/72  Pulse: (!) 118 (!) 124 95 95  Resp: Temp: 98 F (36.7 C) (!) 97.4 F (36.3 C) 98.1 F (36.7 C) 98 F (36.7 C)  TempSrc:  Oral Oral Oral Oral  SpO2: 98% 95% 100% 94%  Weight:      Height:        Wt Readings from Last 3 Encounters:  06/30/19 86.2 kg  06/18/19 99.8 kg     Intake/Output Summary (Last 24 hours) at 07/03/2019 1427 Last data filed at 07/03/2019 0946 Gross per 24 hour  Intake 240 ml  Output 600 ml  Net -360 ml     Physical Exam Gen Exam:Alert awake-not in any distress HEENT:atraumatic, normocephalic Chest: B/L clear to auscultation anteriorly CVS:S1S2 regular Abdomen:soft non tender, non distended Extremities:no edema-both legs are warm to touch-pedal pulses are palpable. Neurology: Non focal Skin: no rash   Data Review:    CBC Recent Labs  Lab 06/28/19 0430 07/01/19 0046  WBC 18.8* 15.4*  HGB 14.4 15.5  HCT 43.5 46.3  PLT 473* 450*  MCV 92.9 93.0  MCH 30.8 31.1  MCHC 33.1 33.5  RDW 13.6 13.8    Chemistries  Recent Labs  Lab 06/29/19 1002 06/30/19 0358 07/01/19 0046 07/02/19 0650 07/03/19 0150  NA 137 135 136 135 133*  K 3.7 4.5 3.7 3.9 3.5  CL 92* 95* 96* 93* 93*  CO2 GLUCOSE 194* 145* 125* 116* 109*  BUN 43* 46* 41* 40* 39*  CREATININE 0.91 0.88 0.85 0.99 1.03  CALCIUM 8.8* 8.7* 8.6* 8.7* 8.3*  AST 27 34 ALT 49* 47* 42 42 41  ALKPHOS 61 58 59 66 66  BILITOT 1.3* 1.5* 1.2 1.5* 1.9*   ------------------------------------------------------------------------------------------------------------------ No results for input(s): CHOL, HDL, LDLCALC, TRIG, CHOLHDL, LDLDIRECT in the last 72 hours.  Lab Results  Component Value Date   HGBA1C 6.4 (H) 06/19/2019   ------------------------------------------------------------------------------------------------------------------ No results for input(s): TSH, T4TOTAL, T3FREE, THYROIDAB in the last 72 hours.  Invalid input(s): FREET3 ------------------------------------------------------------------------------------------------------------------ No results for input(s): VITAMINB12,  FOLATE, FERRITIN, TIBC, IRON, RETICCTPCT in the last 72 hours.  Coagulation profile No results for input(s): INR, PROTIME in the last 168 hours.  Recent Labs    07/01/19 0046  DDIMER 0.42    Cardiac Enzymes No results for input(s): CKMB, TROPONINI, MYOGLOBIN in the last 168 hours.  Invalid input(s): CK ------------------------------------------------------------------------------------------------------------------    Component Value Date/Time   BNP 23.3 06/22/2019 0400    Micro Results No results found for this or any previous visit (from the past 240 hour(s)).  Radiology Reports DG Chest 1 View  Result Date: 06/30/2019 CLINICAL DATA:  Shortness of breath. EXAM: CHEST  1 VIEW COMPARISON:  06/18/2019 FINDINGS: Lungs are hypoinflated with mild patchy airspace opacification bilaterally left worse than right without significant change. Possible small amount left pleural fluid. Mild stable cardiomegaly. Remainder of the exam is unchanged. IMPRESSION: Hypoinflation with stable bilateral patchy airspace process likely multifocal infection. Mild stable cardiomegaly. Electronically Signed   By: Elberta Fortis M.D.   On: 06/30/2019 04:07   CT ANGIO CHEST PE W OR WO CONTRAST  Result Date: 06/19/2019 CLINICAL DATA:  Shortness of breath. Weakness. EXAM: CT ANGIOGRAPHY CHEST WITH CONTRAST TECHNIQUE: Multidetector CT imaging of the chest was performed  using the standard protocol during bolus administration of intravenous contrast. Multiplanar CT image reconstructions and MIPs were obtained to evaluate the vascular anatomy. CONTRAST:  75mL OMNIPAQUE IOHEXOL 350 MG/ML SOLN COMPARISON:  Radiograph yesterday. FINDINGS: Cardiovascular: There are no filling defects within the pulmonary arteries to suggest pulmonary embolus. Mild aortic atherosclerosis. No aortic dissection. Mild multi chamber cardiomegaly. No pericardial effusion. Mediastinum/Nodes: No enlarged mediastinal or hilar lymph nodes. Small  upper paratracheal nodes are subcentimeter. Decompressed esophagus. No thyroid nodule. Lungs/Pleura: Bilateral heterogeneous primarily ground-glass opacities throughout both lungs, peripheral and slight basilar predominant distribution. No pleural fluid. Trachea and mainstem bronchi are patent. Upper Abdomen: No acute findings. Musculoskeletal: There are no acute or suspicious osseous abnormalities. Mild scoliosis and degenerative change in the spine. Review of the MIP images confirms the above findings. IMPRESSION: 1. No pulmonary embolus. 2. Bilateral heterogeneous primarily ground-glass opacities throughout both lungs, consistent with COVID-19 pneumonia. 3. Mild cardiomegaly. Aortic Atherosclerosis (ICD10-I70.0). Electronically Signed   By: Narda RutherfordMelanie  Sanford M.D.   On: 06/19/2019 05:08   US Venous Img Lower Unilateral Right  Result Date: 06/18/2019 CLINICAL DATA:  Pain and swelling EXAM: RIGHT LOWER EXTREMITY VENOUS DOPPLER ULTRASOUND TECHNIQUE: Gray-scale sonography with graded compression, as well as color Doppler and duplex ultrasound were performed to evaluate the lower extremity deep venous systems from the level of the common femoral vein and including the common femoral, femoral, profunda femoral, popliteal and calf veins including the posterior tibial, peroneal and gastrocnemius veins when visible. The superficial great saphenous vein was also interrogated. Spectral Doppler was utilized to evaluate flow at rest and with distal augmentation maneuvers in the common femoral, femoral and popliteal veins. COMPARISON:  None. FINDINGS: Contralateral Common Femoral Vein: Respiratory phasicity is normal and symmetric with the symptomatic side. No evidence of thrombus. Normal compressibility. Common Femoral Vein: No evidence of thrombus. Normal compressibility, respiratory phasicity and response to augmentation. Saphenofemoral Junction: No evidence of thrombus. Normal compressibility and flow on color Doppler  imaging. Profunda Femoral Vein: No evidence of thrombus. Normal compressibility and flow on color Doppler imaging. Femoral Vein: No evidence of thrombus. Normal compressibility, respiratory phasicity and response to augmentation. Popliteal Vein: No evidence of thrombus. Normal compressibility, respiratory phasicity and response to augmentation. Calf Veins: No evidence of thrombus. Normal compressibility and flow on color Doppler imaging. Superficial Great Saphenous Vein: No evidence of thrombus. Normal compressibility. Venous Reflux:  None. Other Findings:  None. IMPRESSION: No evidence of deep venous thrombosis. Electronically Signed   By: Katherine Mantlehristopher  Green M.D.   On: 06/18/2019 23:10   DG Chest Port 1 View  Result Date: 06/18/2019 CLINICAL DATA:  Shortness of breath EXAM: PORTABLE CHEST 1 VIEW COMPARISON:  None. FINDINGS: Low lung volumes with bibasilar atelectasis. Cardiomegaly. No overt edema or effusions. No acute bony abnormality. IMPRESSION: Low lung volumes, bibasilar atelectasis. Electronically Signed   By: Charlett NoseKevin  Dover M.D.   On: 06/18/2019 21:37

## 2019-07-03 NOTE — TOC Progression Note (Signed)
Transition of Care [facility]) - Progression Note    Patient Details  Name: William Beard MRN: [id number] Date of Birth: 1950-07-15  [facility] of [facility]) CM/SW Contact  Joaquin Courts, RN Phone Number: 07/03/2019, 4:49 PM  Clinical Narrative:   CM received call back from patient's daughter William Beard. She reports she has made arrangements for patient to stay in a hotel [facility] [place], [place] [address]) she is uncertain exactly what room and will call the hotel and obtain that informations. Reservation starts tomorrow 07/04/19.  William Beard is also in agreement to utilize [employer] for O2 needs and understands she will have to provide credit card number to agency and be responsible for the cost of O2 once patient gets to [place].  CM spoke with [employer] rep and confirmed that they can provide a concentrator to patient tomorrow. CM will need to follow up with rep and notify her of patient's hotel room number once daughter provides this information.  Huey Romans will then be able to deliver O2 set up to hotel and patient could then discharge there and await his families arrival.    Of note, CM received message from bedside RN stating a family member named Hinton Dyer called and wanted to discuss dc planning and stated no one could pick up patient.  William Beard states Hinton Dyer is her brother's wife, however, William Beard has been making all discharge arrangements and will continue to do so and remain the primary contact person.       Expected Discharge Plan: Home/Self Care Barriers to Discharge: Continued Medical Work up  Expected Discharge Plan and Services Expected Discharge Plan: Home/Self Care In-house Referral: Clinical Social Work Discharge Planning Services: [facility] Post Acute Care Choice: Durable Medical Equipment                   DME Arranged: Oxygen DME Agency: [place] Date DME Agency Contacted: 06/26/19 Time DME Agency Contacted: [phone] Representative spoke with at  DME Agency: Magda Paganini             Social Determinants of Health ([place]) Interventions    Readmission Risk Interventions No flowsheet data found.

## 2019-07-03 NOTE — Progress Notes (Signed)
Physical Therapy Treatment Patient Details Name: William Beard MRN: [id number] DOB: Feb 09, 1951 Today's Date: 07/03/2019    History of Present Illness William Beard  is a 68 y.o. 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  Presented to [facility] ER where he was found to have acute hypoxic respiratory failure due to COVID-19 pneumonia.  He was placed on 5 L nasal cannula oxygen and sent to [facility]    PT Comments    Patient walked earlier today with RN on room air only. Per RN, pt was very dyspneic and sats were low on return to room. She applied 2L of O2 and pt has remained on 2L since that time. During in room exercises, pt on room air and tolerated sats 89-91%. During ambulation, used 2L O2 and could not register sats as poor waveform throughout. Once seated in his room, moderate dyspnea (although pt denied) and sats 89% (once monitor began registering--~2 minutes after seated). Patient required min assist with gait with staggering loss of balance when turning his head to look. Currently pt would be a high fall risk if discharged alone to hotel (and would need home O2 at hotel).     Follow Up Recommendations  No PT follow up (pt could benefit from OPPT, however no insurance coverage)     Equipment Recommendations  None recommended by PT    Recommendations for Other Services       Precautions / Restrictions Precautions Precautions: Fall Restrictions Weight Bearing Restrictions: No    Mobility  Bed Mobility                  Transfers Overall transfer level: Needs assistance Equipment used: None Transfers: Sit to/from Stand Sit to Stand: Supervision         General transfer comment: for safety and multiple lines  Ambulation/Gait Ambulation/Gait assistance: Min assist Gait  Distance (Feet): 200 Feet Assistive device: 1 person hand held assist(pushe WC as a rollator,) Gait Pattern/deviations: Step-through pattern;Drifts right/left;Wide base of support Gait velocity: decr   General Gait Details: required steadying assist x 1 (turning to his left but looking to his right with stagger to his left); no rest stops   Stairs             Wheelchair Mobility    Modified Rankin (Stroke Patients Only)       Balance Overall balance assessment: Mild deficits observed, not formally tested;Needs assistance   Sitting balance-Leahy Scale: Normal     Standing balance support: No upper extremity supported Standing balance-Leahy Scale: Fair Standing balance comment: static balance was OK; dynamic requires UE support                            Cognition Arousal/Alertness: Awake/alert Behavior During Therapy: WFL for tasks assessed/performed Overall Cognitive Status: Within Functional Limits for tasks assessed                                 General Comments: feeling a bit stronger, but asking why he is so weak overall      Exercises General Exercises - Lower Extremity Ankle Circles/Pumps: AROM;Both;10 reps;Seated Heel Raises: Strengthening;Both;10 reps;Standing(bed elevated and holding foot board bil UEs) Mini-Sqauts:  Strengthening;Both;10 reps;Standing Other Exercises Other Exercises: pt reports he is using his theraband at least 1x per day; reports doing general ROM with his legs throughout the day    General Comments General comments (skin integrity, edema, etc.): Pt on 2L on arrival with 3 oxygen extension tubes; sat monitor working intermittently; changed probe and problems persisted; tried second monitor and problems persisted. If pt sat completely still, it would register 89-91%      Pertinent Vitals/Pain Pain Assessment: No/denies pain    Home Living                      Prior Function            PT  Goals (current goals can now be found in the care plan section) Acute Rehab PT Goals Patient Stated Goal: to get better Time For Goal Achievement: 07/05/19 Potential to Achieve Goals: Good Progress towards PT goals: Progressing toward goals    Frequency    Min 3X/week      PT Plan Current plan remains appropriate    Co-evaluation              AM-PAC PT "6 Clicks" Mobility   Outcome Measure  Help needed turning from your back to your side while in a flat bed without using bedrails?: None Help needed moving from lying on your back to sitting on the side of a flat bed without using bedrails?: None Help needed moving to and from a bed to a chair (including a wheelchair)?: A Little Help needed standing up from a chair using your arms (e.g., wheelchair or bedside chair)?: A Little Help needed to walk in hospital room?: A Little Help needed climbing 3-5 steps with a railing? : A Little 6 Click Score: 20    End of Session Equipment Utilized During Treatment: Oxygen Activity Tolerance: Patient limited by fatigue(required seated rest during LE exercises) Patient left: in chair;with call bell/phone within reach Nurse Communication: Mobility status;Other (comment)(concern re: ?d/c to hotel per MD note) PT Visit Diagnosis: Difficulty in walking, not elsewhere classified (R26.2)     Time: 0867-6195 PT Time Calculation (min) (ACUTE ONLY): 41 min  Charges:  $Gait Training: 8-22 mins $Therapeutic Exercise: 8-22 mins                      Jerolyn Center, PT Pager [phone]    Zena Amos 07/03/2019, 4:54 PM

## 2019-07-04 LAB — COMPREHENSIVE METABOLIC PANEL
ALT: 36 U/L (ref 0–44)
AST: 24 U/L (ref 15–41)
Albumin: 3.1 g/dL — ABNORMAL LOW (ref 3.5–5.0)
Alkaline Phosphatase: 64 U/L (ref 38–126)
Anion gap: 12 (ref 5–15)
BUN: 29 mg/dL — ABNORMAL HIGH (ref 8–23)
CO2: 27 mmol/L (ref 22–32)
Calcium: 8.3 mg/dL — ABNORMAL LOW (ref 8.9–10.3)
Chloride: 94 mmol/L — ABNORMAL LOW (ref 98–111)
Creatinine, Ser: 0.77 mg/dL (ref 0.61–1.24)
GFR calc Af Amer: >60 mL/min (ref >60)
GFR calc non Af Amer: >60 mL/min (ref >60)
Glucose, Bld: 145 mg/dL — ABNORMAL HIGH (ref 70–99)
Potassium: 3.4 mmol/L — ABNORMAL LOW (ref 3.5–5.1)
Sodium: 133 mmol/L — ABNORMAL LOW (ref 135–145)
Total Bilirubin: 1.6 mg/dL — ABNORMAL HIGH (ref 0.3–1.2)
Total Protein: 6.2 g/dL — ABNORMAL LOW (ref 6.5–8.1)

## 2019-07-04 LAB — CBC
HCT: 43.1 % (ref 39.0–52.0)
Hemoglobin: 14.5 g/dL (ref 13.0–17.0)
MCH: 31.4 pg (ref 26.0–34.0)
MCHC: 33.6 g/dL (ref 30.0–36.0)
MCV: 93.3 fL (ref 80.0–100.0)
Platelets: 298 10*3/uL (ref 150–400)
RBC: 4.62 MIL/uL (ref 4.22–5.81)
RDW: 13.8 % (ref 11.5–15.5)
WBC: 12 10*3/uL — ABNORMAL HIGH (ref 4.0–10.5)
nRBC: 0 % (ref 0.0–0.2)

## 2019-07-04 LAB — GLUCOSE, CAPILLARY
Glucose-Capillary: 110 mg/dL — ABNORMAL HIGH (ref 70–99)
Glucose-Capillary: 95 mg/dL (ref 70–99)
Glucose-Capillary: 133 mg/dL — ABNORMAL HIGH (ref 70–99)
Glucose-Capillary: 141 mg/dL — ABNORMAL HIGH (ref 70–99)
Glucose-Capillary: 140 mg/dL — ABNORMAL HIGH (ref 70–99)

## 2019-07-04 MED ORDER — POTASSIUM CHLORIDE CRYS ER 20 MEQ PO TBCR
40.0000 meq | EXTENDED_RELEASE_TABLET | Freq: Once | ORAL | Status: AC
  Administered 2019-07-04: 40 meq via ORAL
  Filled 2019-07-04: qty 2

## 2019-07-04 NOTE — Progress Notes (Signed)
PROGRESS NOTE    William Beard  WFU:[id number] DOB: April 26, 1951 DOA: 06/19/2019 PCP: Patient, No Pcp Per   Brief Narrative:  Patient is William Beard 68 y.o. male with no significant past medical history-who presented with acute hypoxic respiratory failure in the setting of COVID-19 pneumonia.  Patient responded well to steroids and remdesivir-however continues to have significant desaturation and dyspnea with exertion.  He probably will require home O2 on discharge.  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  Although PT recommending CIR-he is not [place] CIR candidate per social work.  Disposition is difficult-family is working on getting William Beard Junio family member here in the next few days-to see if they can then take him back to [place].  Assessment & Plan:   Active Problems:   COVID-19 virus infection  Acute Hypoxic Resp Failure due to Covid 19 Viral pneumonia:  On RA at rest and requires O2 with activity CXR 12/26 with bilateral patchy airspace process CTA chest without PE on 12/15, COVID pneumonia No evidence VTE S/p steroids, remdesivir I/O, daily weights IS, OOB, prone as able  COVID-19 Labs  No results for input(s): DDIMER, FERRITIN, LDH, CRP in the last 72 hours.  No results found for: SARSCOV2NAA  Hypokalemia: replace and follow  Transient episode of atrial fibrillation: Provoked due to acute illness from Covid.  Back in sinus rhythm-continue aspirin and metoprolol. Chadsvasc is 1 based on age, needs outpatient follow up  Low moderate risk, discussed need for cards f/u outpatient, continue ASA  Obesity: Estimated body mass index is 27.26 kg/m as calculated from the following:   Height as of this encounter: 5\' 10"  (1.778 m).   Weight as of this encounter: 86.2 kg.   DVT prophylaxis: lovenox Code Status: full  Family Communication: daughter Disposition Plan: pending  Consultants:   none  Procedures:    RLE US IMPRESSION: No evidence of deep venous thrombosis.  Antimicrobials:  Anti-infectives (From admission, onward)   Start     Dose/Rate Route Frequency Ordered Stop   06/20/19 1000  remdesivir 100 mg in sodium chloride 0.9 % 100 mL IVPB  Status:  Discontinued     100 mg 200 mL/hr over 30 Minutes Intravenous Daily 06/19/19 1155 06/19/19 1205   06/19/19 2000  remdesivir 100 mg in sodium chloride 0.9 % 100 mL IVPB     100 mg 200 mL/hr over 30 Minutes Intravenous Daily 06/19/19 1206 06/22/19 1634   06/19/19 1200  remdesivir 200 mg in sodium chloride 0.9% 250 mL IVPB  Status:  Discontinued     200 mg 580 mL/hr over 30 Minutes Intravenous Once 06/19/19 1155 06/19/19 1205     Subjective: No new complaints. Discussed d/c plan  Objective: Vitals:   07/04/19 0324 07/04/19 0746 07/04/19 1625 07/04/19 1626  BP: 95/80 102/72 105/70   Pulse: 89 84 (!) 120 (!) 113  Resp: 20 18 18    Temp: 97.8 F (36.6 C) 98.4 F (36.9 C) 98 F (36.7 C)   TempSrc: Oral Oral Oral   SpO2: 94% 98% 92%   Weight:      Height:        Intake/Output Summary (Last 24 hours) at 07/04/2019 1818 Last data filed at 07/04/2019 1502 Gross per 24 hour  Intake --  Output 850 ml  Net -850 ml   Filed Weights   06/19/19 1828 06/30/19 0420  Weight: 99.8 kg 86.2 kg    Examination:  General exam: Appears calm  and comfortable  Respiratory system: Clear to auscultation. Respiratory effort normal. Cardiovascular system: RRR Gastrointestinal system: Abdomen is nondistended, soft and nontender.  Central nervous system: Alert and oriented. No focal neurological deficits. Extremities: no LEE Skin: No rashes, lesions or ulcers Psychiatry: Judgement and insight appear normal. Mood & affect appropriate.     Data Reviewed: I have personally reviewed following labs and imaging studies  CBC: Recent Labs  Lab 06/28/19 0430 07/01/19 0046 07/04/19 1125  WBC 18.8* 15.4* 12.0*  HGB 14.4 15.5 14.5  HCT 43.5 46.3  43.1  MCV 92.9 93.0 93.3  PLT 473* 450* 298   Basic Metabolic Panel: Recent Labs  Lab 06/30/19 0358 07/01/19 0046 07/02/19 0650 07/03/19 0150 07/04/19 1125  NA 135 136 135 133* 133*  K 4.5 3.7 3.9 3.5 3.4*  CL 95* 96* 93* 93* 94*  CO2 26 29 31 29 27   GLUCOSE 145* 125* 116* 109* 145*  BUN 46* 41* 40* 39* 29*  CREATININE 0.88 0.85 0.99 1.03 0.77  CALCIUM 8.7* 8.6* 8.7* 8.3* 8.3*   GFR: Estimated Creatinine Clearance: 90 mL/min (by C-G formula based on SCr of 0.77 mg/dL). Liver Function Tests: Recent Labs  Lab 06/30/19 0358 07/01/19 0046 07/02/19 0650 07/03/19 0150 07/04/19 1125  AST 34 23 25 27 24   ALT 47* 42 42 41 36  ALKPHOS 58 59 66 66 64  BILITOT 1.5* 1.2 1.5* 1.9* 1.6*  PROT 6.3* 6.1* 6.4* 6.4* 6.2*  ALBUMIN 3.1* 3.0* 3.1* 3.3* 3.1*   No results for input(s): LIPASE, AMYLASE in the last 168 hours. No results for input(s): AMMONIA in the last 168 hours. Coagulation Profile: No results for input(s): INR, PROTIME in the last 168 hours. Cardiac Enzymes: No results for input(s): CKTOTAL, CKMB, CKMBINDEX, TROPONINI in the last 168 hours. BNP (last 3 results) No results for input(s): PROBNP in the last 8760 hours. HbA1C: No results for input(s): HGBA1C in the last 72 hours. CBG: Recent Labs  Lab 07/03/19 1620 07/03/19 1948 07/04/19 0818 07/04/19 1233 07/04/19 1650  GLUCAP 127* 110* 95 133* 141*   Lipid Profile: No results for input(s): CHOL, HDL, LDLCALC, TRIG, CHOLHDL, LDLDIRECT in the last 72 hours. Thyroid Function Tests: No results for input(s): TSH, T4TOTAL, FREET4, T3FREE, THYROIDAB in the last 72 hours. Anemia Panel: No results for input(s): VITAMINB12, FOLATE, FERRITIN, TIBC, IRON, RETICCTPCT in the last 72 hours. Sepsis Labs: Recent Labs  Lab 06/29/19 2235 06/30/19 0358  LATICACIDVEN 3.0* 2.5*    No results found for this or any previous visit (from the past 240 hour(s)).       Radiology Studies: No results  found.      Scheduled Meds: . aspirin  81 mg Oral Daily  . enoxaparin (LOVENOX) injection  40 mg Subcutaneous Q24H  . insulin aspart  0-5 Units Subcutaneous QHS  . insulin aspart  0-9 Units Subcutaneous TID WC  . metoprolol tartrate  25 mg Oral BID  . polyethylene glycol  17 g Oral Daily   Continuous Infusions:   LOS: 15 days    Time spent: over 30 min    [phone], MD [facility] Pager AMION  If 7PM-7AM, please contact night-coverage [email or web address] Password [email or web address] 07/04/2019, 6:18 PM

## 2019-07-04 NOTE — Progress Notes (Signed)
Occupational Therapy Treatment Patient Details Name: William Beard MRN: [id number] DOB: [birth year] Today's Date: 07/04/2019    History of present illness William Beard  is a 68 y.o. 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  Presented to [facility] ER where he was found to have acute hypoxic respiratory failure due to COVID-19 pneumonia.  He was placed on 5 L nasal cannula oxygen and sent to [facility]   OT comments  Utilized interpreter services [phone]) for session. Pt continues to make progress in therapy, demonstrating increased standing/activity tolerance. Pt on 0.5L oxygen with SpO2 96% at rest. Pt able to ambulate to/from bathroom without an assistive device, noting 0 instances of loss of balance. Pt stood at the sink 1 x 15 min to complete grooming, hygiene, and sponge bathing task. Pt's SpO2 maintained in 90s throughout on room air. Pt engaged in lower body dressing task while seated, demonstrating good balance and independence with task. Continued education with pt on safety strategies, activity modifications, and energy conservation techniques with fair understanding. Continue to recommend [facility] OT for additional rehab following hospital discharge.  Pt reported dizziness throughout session with BP as follows: BP Sitting 123/1mmHg BP Standing 116/66mmHg     Follow Up Recommendations  Home health OT    Equipment Recommendations  3 in 1 bedside commode(for use in shower)    Recommendations for Other Services      Precautions / Restrictions Precautions Precautions: Fall Restrictions Weight Bearing Restrictions: No       Mobility Bed Mobility Overal bed mobility: Independent                Transfers Overall transfer level: Needs assistance Equipment used: None Transfers: Sit  to/from Stand Sit to Stand: Supervision         General transfer comment: to ensure balance and safety    Balance Overall balance assessment: Mild deficits observed, not formally tested                                         ADL either performed or assessed with clinical judgement   ADL Overall ADL's : Needs assistance/impaired     Grooming: Set up;Supervision/safety;Standing;Wash/dry hands;Wash/dry face;Oral care   Upper Body Bathing: Supervision/ safety;Set up;Standing   Lower Body Bathing: Supervison/ safety;Set up;Sit to/from stand Lower Body Bathing Details (indicate cue type and reason): Standing at the sink     Lower Body Dressing: Supervision/safety;Set up;Sit to/from stand               Functional mobility during ADLs: Supervision/safety General ADL Comments: Pt able to ambulate to/from bathroom without an assistive device.     Vision       Perception     Praxis      Cognition Arousal/Alertness: Awake/alert Behavior During Therapy: WFL for tasks assessed/performed Overall Cognitive Status: Within Functional Limits for tasks assessed                                          Exercises     Shoulder Instructions       General Comments Pt tolerated standing  15 min at the sink to complete grooming, hygiene, and sponge bathing task. Pts SpO2 maintained in 90s throughout on room air. Pt continues to report dizziness throughout all tasks. No change noted in BP.     Pertinent Vitals/ Pain       Pain Assessment: No/denies pain  Home Living                                          Prior Functioning/Environment              Frequency           Progress Toward Goals  OT Goals(current goals can now be found in the care plan section)  Progress towards OT goals: Progressing toward goals  ADL Goals Additional ADL Goal #1: Pt will complete ADL with SpO2 remaining above 88 Additional ADL  Goal #2: Pt will independently demonstrte pursed lip breathing techniques during ADL adn functional mobility for ADL  Plan Discharge plan remains appropriate    Co-evaluation                 AM-PAC OT "6 Clicks" Daily Activity     Outcome Measure   Help from another person eating meals?: None Help from another person taking care of personal grooming?: A Little Help from another person toileting, which includes using toliet, bedpan, or urinal?: A Little Help from another person bathing (including washing, rinsing, drying)?: A Little Help from another person to put on and taking off regular upper body clothing?: None Help from another person to put on and taking off regular lower body clothing?: A Little 6 Click Score: 20    End of Session    OT Visit Diagnosis: Muscle weakness (generalized) (M62.81)   Activity Tolerance Patient tolerated treatment well   Patient Left in chair;with call bell/phone within reach   Nurse Communication Mobility status        Time: 8676-7209 OT Time Calculation (min): 42 min  Charges: OT General Charges $OT Visit: 1 Visit OT Treatments $Self Care/Home Management : 8-22 mins $Therapeutic Activity: 23-37 mins  Mauri Brooklyn OTR/L [phone]    Mauri Brooklyn 07/04/2019, 5:06 PM

## 2019-07-04 NOTE — Progress Notes (Signed)
CSW spoke with patients daughter, Nelva Bush, regarding disposition plans to hotel for today. MD and this writer voiced concerns with patient going to hotel paid for by family due to multiple reasons (no one available to get hotel key, hotel not aware patient covid positive, not able to get food, etc). Patients daughter, Nelva Bush, stated that son, Adine Madura [phone], will be able to pick patient up from the hospital on Friday 1/1 once he flies into [place]. MD agreeable this plan.  CSW has contacted [employer] and o2 to be arranged for delivered to hospital or to be delivered on day of sons arrival to [facility].   Kingsley Spittle, LCSW [facility]  [phone]

## 2019-07-05 LAB — CBC WITH DIFFERENTIAL/PLATELET
Abs Immature Granulocytes: 0.05 10*3/uL (ref 0.00–0.07)
Basophils Absolute: 0 10*3/uL (ref 0.0–0.1)
Basophils Relative: 0 %
Eosinophils Absolute: 0.3 10*3/uL (ref 0.0–0.5)
Eosinophils Relative: 2 %
HCT: 41.2 % (ref 39.0–52.0)
Hemoglobin: 13.9 g/dL (ref 13.0–17.0)
Immature Granulocytes: 0 %
Lymphocytes Relative: 9 %
Lymphs Abs: 1 10*3/uL (ref 0.7–4.0)
MCH: 31.7 pg (ref 26.0–34.0)
MCHC: 33.7 g/dL (ref 30.0–36.0)
MCV: 93.8 fL (ref 80.0–100.0)
Monocytes Absolute: 1.5 10*3/uL — ABNORMAL HIGH (ref 0.1–1.0)
Monocytes Relative: 13 %
Neutro Abs: 8.7 10*3/uL — ABNORMAL HIGH (ref 1.7–7.7)
Neutrophils Relative %: 76 %
Platelets: 292 10*3/uL (ref 150–400)
RBC: 4.39 MIL/uL (ref 4.22–5.81)
RDW: 13.8 % (ref 11.5–15.5)
WBC: 11.6 10*3/uL — ABNORMAL HIGH (ref 4.0–10.5)
nRBC: 0 % (ref 0.0–0.2)

## 2019-07-05 LAB — COMPREHENSIVE METABOLIC PANEL
ALT: 33 U/L (ref 0–44)
AST: 22 U/L (ref 15–41)
Albumin: 3.3 g/dL — ABNORMAL LOW (ref 3.5–5.0)
Alkaline Phosphatase: 67 U/L (ref 38–126)
Anion gap: 13 (ref 5–15)
BUN: 27 mg/dL — ABNORMAL HIGH (ref 8–23)
CO2: 26 mmol/L (ref 22–32)
Calcium: 8.3 mg/dL — ABNORMAL LOW (ref 8.9–10.3)
Chloride: 94 mmol/L — ABNORMAL LOW (ref 98–111)
Creatinine, Ser: 1.02 mg/dL (ref 0.61–1.24)
GFR calc Af Amer: >60 mL/min (ref >60)
GFR calc non Af Amer: >60 mL/min (ref >60)
Glucose, Bld: 101 mg/dL — ABNORMAL HIGH (ref 70–99)
Potassium: 4 mmol/L (ref 3.5–5.1)
Sodium: 133 mmol/L — ABNORMAL LOW (ref 135–145)
Total Bilirubin: 1.9 mg/dL — ABNORMAL HIGH (ref 0.3–1.2)
Total Protein: 6.3 g/dL — ABNORMAL LOW (ref 6.5–8.1)

## 2019-07-05 LAB — GLUCOSE, CAPILLARY
Glucose-Capillary: 104 mg/dL — ABNORMAL HIGH (ref 70–99)
Glucose-Capillary: 131 mg/dL — ABNORMAL HIGH (ref 70–99)
Glucose-Capillary: 88 mg/dL (ref 70–99)
Glucose-Capillary: 116 mg/dL — ABNORMAL HIGH (ref 70–99)

## 2019-07-05 LAB — PHOSPHORUS: Phosphorus: 3.6 mg/dL (ref 2.5–4.6)

## 2019-07-05 LAB — D-DIMER, QUANTITATIVE: D-Dimer, Quant: 0.41 ug/mL-FEU (ref 0.00–0.50)

## 2019-07-05 LAB — FERRITIN: Ferritin: 715 ng/mL — ABNORMAL HIGH (ref 24–336)

## 2019-07-05 LAB — C-REACTIVE PROTEIN: CRP: 5.9 mg/dL — ABNORMAL HIGH (ref <1.0)

## 2019-07-05 LAB — MAGNESIUM: Magnesium: 2.4 mg/dL (ref 1.7–2.4)

## 2019-07-05 MED ORDER — METOPROLOL TARTRATE 25 MG PO TABS
12.5000 mg | ORAL_TABLET | Freq: Two times a day (BID) | ORAL | Status: DC
  Filled 2019-07-05 (×2): qty 1

## 2019-07-05 NOTE — Plan of Care (Signed)
Went over [place] with pt and daughter as pt requested. All questions asked and all questions answered.

## 2019-07-05 NOTE — Progress Notes (Signed)
PROGRESS NOTE    William Beard  WUJ:[id number] DOB: Sep 21, 1950 DOA: 06/19/2019 PCP: Patient, No Pcp Per   Brief Narrative:  Patient is William Beard 68 y.o. male with no significant past medical history-who presented with acute hypoxic respiratory failure in the setting of COVID-19 pneumonia.  Patient responded well to steroids and remdesivir-however continues to have significant desaturation and dyspnea with exertion.  He probably will require home O2 on discharge.  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  Although PT recommending CIR-he is not William Beard CIR candidate per social work.  Disposition is difficult-family is working on getting William Beard family member here in the next few days-to see if they can then take him back to [place].  Assessment & Plan:   Active Problems:   COVID-19 virus infection  Acute Hypoxic Resp Failure due to Covid 19 Viral pneumonia:  On RA at rest and requires O2 with activity CXR 12/26 with bilateral patchy airspace process CTA chest without PE on 12/15, COVID pneumonia No evidence VTE S/p steroids, remdesivir I/O, daily weights IS, OOB, prone as able  COVID-19 Labs  Recent Labs    07/05/19 0607  DDIMER 0.41  FERRITIN 715*  CRP 5.9*    No results found for: SARSCOV2NAA  Hypokalemia: replace and follow  Transient episode of atrial fibrillation: Provoked due to acute illness from Covid.  Back in sinus rhythm-continue aspirin and metoprolol. Chadsvasc is 1 based on age, needs outpatient follow up  Low moderate risk, discussed need for cards f/u outpatient, continue ASA  Obesity: Estimated body mass index is 27.26 kg/m as calculated from the following:   Height as of this encounter: 5\' 10"  (1.778 m).   Weight as of this encounter: 86.2 kg.   DVT prophylaxis: lovenox Code Status: full  Family Communication: daughter Disposition Plan: pending - plan for discharge tomorrow (initially  plan for discharge to hotel, but in setting of COVID positive status and need to quarantine, felt that that risk of exposure at hotel is high - son to come tomorrow and drive home together)  Consultants:   none  Procedures:  RLE US IMPRESSION: No evidence of deep venous thrombosis.  Antimicrobials:  Anti-infectives (From admission, onward)   Start     Dose/Rate Route Frequency Ordered Stop   06/20/19 1000  remdesivir 100 mg in sodium chloride 0.9 % 100 mL IVPB  Status:  Discontinued     100 mg 200 mL/hr over 30 Minutes Intravenous Daily 06/19/19 1155 06/19/19 1205   06/19/19 2000  remdesivir 100 mg in sodium chloride 0.9 % 100 mL IVPB     100 mg 200 mL/hr over 30 Minutes Intravenous Daily 06/19/19 1206 06/22/19 1634   06/19/19 1200  remdesivir 200 mg in sodium chloride 0.9% 250 mL IVPB  Status:  Discontinued     200 mg 580 mL/hr over 30 Minutes Intravenous Once 06/19/19 1155 06/19/19 1205     Subjective: No new complaints  Objective: Vitals:   07/05/19 0300 07/05/19 0700 07/05/19 0710 07/05/19 1600  BP: 105/71 99/64 (!) 85/60 107/71  Pulse: 98 97  93  Resp: 18 18  16   Temp: 97.7 F (36.5 C) 98.2 F (36.8 C)  97.7 F (36.5 C)  TempSrc: Oral Oral  Oral  SpO2: 94% 95%  95%  Weight:      Height:        Intake/Output Summary (Last 24 hours) at 07/05/2019 1614 Last data filed at 07/05/2019  1400 Gross per 24 hour  Intake 100 ml  Output 400 ml  Net -300 ml   Filed Weights   06/19/19 1828 06/30/19 0420  Weight: 99.8 kg 86.2 kg    Examination:  General: No acute distress. Cardiovascular: RRR Lungs: Clear to auscultation bilaterally Abdomen: Soft, nontender, nondistended Neurological: Alert and oriented 3. Moves all extremities 4. Cranial nerves II through XII grossly intact. Skin: Warm and dry. No rashes or lesions. Extremities: No clubbing or cyanosis. No edema.  Data Reviewed: I have personally reviewed following labs and imaging studies  CBC: Recent  Labs  Lab 07/01/19 0046 07/04/19 1125 07/05/19 0607  WBC 15.4* 12.0* 11.6*  NEUTROABS  --   --  8.7*  HGB 15.5 14.5 13.9  HCT 46.3 43.1 41.2  MCV 93.0 93.3 93.8  PLT 450* 298 292   Basic Metabolic Panel: Recent Labs  Lab 07/01/19 0046 07/02/19 0650 07/03/19 0150 07/04/19 1125 07/05/19 0607  NA 136 135 133* 133* 133*  K 3.7 3.9 3.5 3.4* 4.0  CL 96* 93* 93* 94* 94*  CO2 29 31 29 27 26   GLUCOSE 125* 116* 109* 145* 101*  BUN 41* 40* 39* 29* 27*  CREATININE 0.85 0.99 1.03 0.77 1.02  CALCIUM 8.6* 8.7* 8.3* 8.3* 8.3*  MG  --   --   --   --  2.4  PHOS  --   --   --   --  3.6   GFR: Estimated Creatinine Clearance: 70.6 mL/min (by C-G formula based on SCr of 1.02 mg/dL). Liver Function Tests: Recent Labs  Lab 07/01/19 0046 07/02/19 0650 07/03/19 0150 07/04/19 1125 07/05/19 0607  AST 23 25 27 24 22   ALT 42 42 41 36 33  ALKPHOS 59 66 66 64 67  BILITOT 1.2 1.5* 1.9* 1.6* 1.9*  PROT 6.1* 6.4* 6.4* 6.2* 6.3*  ALBUMIN 3.0* 3.1* 3.3* 3.1* 3.3*   No results for input(s): LIPASE, AMYLASE in the last 168 hours. No results for input(s): AMMONIA in the last 168 hours. Coagulation Profile: No results for input(s): INR, PROTIME in the last 168 hours. Cardiac Enzymes: No results for input(s): CKTOTAL, CKMB, CKMBINDEX, TROPONINI in the last 168 hours. BNP (last 3 results) No results for input(s): PROBNP in the last 8760 hours. HbA1C: No results for input(s): HGBA1C in the last 72 hours. CBG: Recent Labs  Lab 07/04/19 1233 07/04/19 1650 07/04/19 2034 07/05/19 0821 07/05/19 1209  GLUCAP 133* 141* 140* 104* 131*   Lipid Profile: No results for input(s): CHOL, HDL, LDLCALC, TRIG, CHOLHDL, LDLDIRECT in the last 72 hours. Thyroid Function Tests: No results for input(s): TSH, T4TOTAL, FREET4, T3FREE, THYROIDAB in the last 72 hours. Anemia Panel: Recent Labs    07/05/19 0607  FERRITIN 715*   Sepsis Labs: Recent Labs  Lab 06/29/19 2235 06/30/19 0358  LATICACIDVEN 3.0*  2.5*    No results found for this or any previous visit (from the past 240 hour(s)).       Radiology Studies: No results found.      Scheduled Meds: . aspirin  81 mg Oral Daily  . enoxaparin (LOVENOX) injection  40 mg Subcutaneous Q24H  . insulin aspart  0-5 Units Subcutaneous QHS  . insulin aspart  0-9 Units Subcutaneous TID WC  . metoprolol tartrate  12.5 mg Oral BID  . polyethylene glycol  17 g Oral Daily   Continuous Infusions:   LOS: 16 days    Time spent: over 30 min    Lacretia Nicksaldwell Powell, MD [facility] Pager AMION  If 7PM-7AM, please contact night-coverage [email or web address] Password [email or web address] 07/05/2019, 4:14 PM

## 2019-07-05 NOTE — Progress Notes (Signed)
   07/05/19 0700  Family/Significant Other Communication  Family/Significant Other Update Called;Updated  Called and gave an update.

## 2019-07-05 NOTE — Progress Notes (Signed)
Physical Therapy Treatment Patient Details Name: William Beard MRN: [id number] DOB: 09-21-1950 Today's Date: 07/05/2019    History of Present Illness William Beard  is a 68 y.o. 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  Presented to [facility] ER where he was found to have acute hypoxic respiratory failure due to COVID-19 pneumonia.  He was placed on 5 L nasal cannula oxygen and sent to [facility]    PT Comments    The patient is improving today, ambulated on RA with SPO2 >90%. Patient did not c/o dizziness today.   BP supine 107/70, sitting 110/79. HR  110 -120 while ambulating. Noted Dyspnea whole ambu;ating, gait is slow. Patient did have small LOB when turning.   Interpreter utilized to instruct patient that while he travels back home,  he should stop every 2-4 hours to get out of car and move around as patient would be at risk for DVT's. Patient acknowledged that he understood. 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 Patient is hopeful to Dc tomorrow, son to come and drive  Patient  To [place] where he will stay for awhile.  Follow Up Recommendations  No PT follow up     Equipment Recommendations  None recommended by PT    Recommendations for Other Services       Precautions / Restrictions Precautions Precaution Comments: requires rest breaks more now    Mobility  Bed Mobility Overal bed mobility: Independent                Transfers   Equipment used: None Transfers: Sit to/from Stand Sit to Stand: Supervision         General transfer comment: slow to rise,  Ambulation/Gait Ambulation/Gait assistance: Min guard;Min assist Gait Distance (Feet): 200 Feet Assistive device: 1 person hand held assist Gait Pattern/deviations: Step-through  pattern;Drifts right/left;Wide base of support Gait velocity: decr   General Gait Details: no HHA x 100', when he turned around, slight balance loss, steady assit ahd HHA provided.   Stairs             Wheelchair Mobility    Modified Rankin (Stroke Patients Only)       Balance     Sitting balance-Leahy Scale: Normal     Standing balance support: During functional activity;No upper extremity supported Standing balance-Leahy Scale: Fair Standing balance comment: static balance was OK; dynamic requires UE support                            Cognition Arousal/Alertness: Awake/alert Behavior During Therapy: WFL for tasks assessed/performed                                          Exercises General Exercises - Lower Extremity Hip ABduction/ADduction: AROM;Standing;10 reps;Both Hip Flexion/Marching: AROM;Both;10 reps;Standing Toe Raises: AROM;Standing;10 reps Mini-Sqauts: AROM;Both;10 reps    General Comments        Pertinent Vitals/Pain Pain Assessment: No/denies pain    Home Living                      Prior Function  PT Goals (current goals can now be found in the care plan section) Acute Rehab PT Goals PT Goal Formulation: With patient Time For Goal Achievement: 07/12/19 Potential to Achieve Goals: Good Progress towards PT goals: Progressing toward goals;Goals met and updated - see care plan    Frequency    Min 3X/week      PT Plan Current plan remains appropriate    Co-evaluation              AM-PAC PT "6 Clicks" Mobility   Outcome Measure  Help needed turning from your back to your side while in a flat bed without using bedrails?: None Help needed moving from lying on your back to sitting on the side of a flat bed without using bedrails?: None Help needed moving to and from a bed to a chair (including a wheelchair)?: None Help needed standing up from a chair using your arms (e.g.,  wheelchair or bedside chair)?: None Help needed to walk in hospital room?: A Little Help needed climbing 3-5 steps with a railing? : A Little 6 Click Score: 22    End of Session   Activity Tolerance: Patient tolerated treatment well Patient left: in bed;with call bell/phone within reach Nurse Communication: Mobility status;Other (comment) PT Visit Diagnosis: Difficulty in walking, not elsewhere classified (R26.2)     Time: 1030-1100 PT Time Calculation (min) (ACUTE ONLY): 30 min  Charges:  $Gait Training: 23-37 mins                     Real Pager [phone] Office [phone]    Claretha Cooper 07/05/2019, 2:29 PM

## 2019-07-06 LAB — COMPREHENSIVE METABOLIC PANEL
ALT: 37 U/L (ref 0–44)
AST: 29 U/L (ref 15–41)
Albumin: 3.2 g/dL — ABNORMAL LOW (ref 3.5–5.0)
Alkaline Phosphatase: 82 U/L (ref 38–126)
Anion gap: 14 (ref 5–15)
BUN: 25 mg/dL — ABNORMAL HIGH (ref 8–23)
CO2: 25 mmol/L (ref 22–32)
Calcium: 8.6 mg/dL — ABNORMAL LOW (ref 8.9–10.3)
Chloride: 94 mmol/L — ABNORMAL LOW (ref 98–111)
Creatinine, Ser: 0.96 mg/dL (ref 0.61–1.24)
GFR calc Af Amer: >60 mL/min (ref >60)
GFR calc non Af Amer: >60 mL/min (ref >60)
Glucose, Bld: 194 mg/dL — ABNORMAL HIGH (ref 70–99)
Potassium: 3.6 mmol/L (ref 3.5–5.1)
Sodium: 133 mmol/L — ABNORMAL LOW (ref 135–145)
Total Bilirubin: 1.1 mg/dL (ref 0.3–1.2)
Total Protein: 6.8 g/dL (ref 6.5–8.1)

## 2019-07-06 LAB — CBC WITH DIFFERENTIAL/PLATELET
Abs Immature Granulocytes: 0.05 10*3/uL (ref 0.00–0.07)
Basophils Absolute: 0 10*3/uL (ref 0.0–0.1)
Basophils Relative: 0 %
Eosinophils Absolute: 0.3 10*3/uL (ref 0.0–0.5)
Eosinophils Relative: 3 %
HCT: 42.6 % (ref 39.0–52.0)
Hemoglobin: 14.5 g/dL (ref 13.0–17.0)
Immature Granulocytes: 1 %
Lymphocytes Relative: 9 %
Lymphs Abs: 1 10*3/uL (ref 0.7–4.0)
MCH: 31.6 pg (ref 26.0–34.0)
MCHC: 34 g/dL (ref 30.0–36.0)
MCV: 92.8 fL (ref 80.0–100.0)
Monocytes Absolute: 1 10*3/uL (ref 0.1–1.0)
Monocytes Relative: 10 %
Neutro Abs: 8.1 10*3/uL — ABNORMAL HIGH (ref 1.7–7.7)
Neutrophils Relative %: 77 %
Platelets: 271 10*3/uL (ref 150–400)
RBC: 4.59 MIL/uL (ref 4.22–5.81)
RDW: 13.8 % (ref 11.5–15.5)
WBC: 10.4 10*3/uL (ref 4.0–10.5)
nRBC: 0 % (ref 0.0–0.2)

## 2019-07-06 LAB — MAGNESIUM: Magnesium: 2.3 mg/dL (ref 1.7–2.4)

## 2019-07-06 LAB — D-DIMER, QUANTITATIVE: D-Dimer, Quant: 0.68 ug/mL-FEU — ABNORMAL HIGH (ref 0.00–0.50)

## 2019-07-06 LAB — PHOSPHORUS: Phosphorus: 3.7 mg/dL (ref 2.5–4.6)

## 2019-07-06 LAB — C-REACTIVE PROTEIN: CRP: 5.8 mg/dL — ABNORMAL HIGH (ref <1.0)

## 2019-07-06 LAB — FERRITIN: Ferritin: 676 ng/mL — ABNORMAL HIGH (ref 24–336)

## 2019-07-06 NOTE — Discharge Summary (Signed)
Physician Discharge Summary  William Beard PZW:[id number] DOB: 1950/08/07 DOA: 06/19/2019  PCP: Patient, No Pcp Per  Admit date: 06/19/2019 Discharge date: 07/07/2019  Time spent: 40 minutes  Recommendations for Outpatient Follow-up:  1. Follow outpatient CBC/CMP 2. Follow outpatient CXR 3. Follow O2 requirement outpatient 4. Follow prediabetes  5. Follow atrial fibrillation outpatient, chadsvasc is 1 - afin was in setting of infection - recommended cardiology follow up - consider outpatient event monitor   Discharge Diagnoses:  Active Problems:   COVID-19 virus infection   Discharge Condition: stable  Diet recommendation: heart healthy  Filed Weights   06/19/19 1828 06/30/19 0420  Weight: 99.8 kg 86.2 kg    History of present illness:  Patient is a78 y.o.malewith no significant past medical history-who presented with acute hypoxic respiratory failure in the setting of COVID-19 pneumonia. Patient responded well to steroids and remdesivir-however continues to have significant desaturation and dyspnea with exertion. He probably will require home O2 on discharge. 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 Although PT recommending CIR-he is not William Beard CIR candidate per social work. Disposition is difficult-family is working on getting William Beard family member here in the next few days-to see if they can then take him back to [place].  He was admitted for COVID 19 pneumonia.  He's improved with steroids and remdesivir.  He was discharged on 1/1 with home oxygen, his family picked him up and are driving with him to [place].  Given return precautions.    Hospital Course:  Acute Hypoxic Resp Failure due to Covid 19 Viral pneumonia: On RA at rest and requires O2 with activity CXR 12/26 with bilateral patchy airspace process CTA chest without PE on 12/15, COVID pneumonia No evidence VTE S/p steroids,  remdesivir I/O, daily weights IS, OOB, prone as able  COVID-19 Labs  Recent Labs    07/05/19 0607 07/06/19 1010  DDIMER 0.41 0.68*  FERRITIN 715* 676*  CRP 5.9* 5.8*    No results found for: SARSCOV2NAA  Hypokalemia: replace and follow  Transient episode of atrial fibrillation: Provoked due to acute illness from Covid. Back in sinus rhythm-continue aspirin and metoprolol. Chadsvasc is 1 based on age, needs outpatient follow up  Low moderate risk, discussed need for cards f/u outpatient  Obesity: Estimated body mass index is 27.26 kg/m as calculated from the following: Height as of this encounter: 5\' 10"  (1.778 m). Weight as of this encounter: 86.2 kg.  Procedures: LE US IMPRESSION: No evidence of deep venous thrombosis.  Consultations:  none  Discharge Exam: Vitals:   07/06/19 0403 07/06/19 0700  BP: 109/68 105/78  Pulse: 97 94  Resp: 16 18  Temp: 98.1 F (36.7 C) (!) 97.5 F (36.4 C)  SpO2: 92% 95%   Feels well, ready to go home No complaitns Discussed d/c recommendations with interpreter   General: No acute distress. Cardiovascular: RRR Lungs: Clear to auscultation bilaterally  Abdomen: Soft, nontender, nondistended  Neurological: Alert and oriented 3. Moves all extremities 4 . Cranial nerves II through XII grossly intact. Skin: Warm and dry. No rashes or lesions. Extremities: No clubbing or cyanosis. No edema.  Discharge Instructions   Discharge Instructions    Call MD for:  difficulty breathing, headache or visual disturbances   Complete by: As directed    Call MD for:  difficulty breathing, headache or visual disturbances   Complete by: As directed    Call MD for:  extreme fatigue  Complete by: As directed    Call MD for:  extreme fatigue   Complete by: As directed    Call MD for:  hives   Complete by: As directed    Call MD for:  hives   Complete by: As directed    Call MD for:  persistant dizziness or light-headedness    Complete by: As directed    Call MD for:  persistant dizziness or light-headedness   Complete by: As directed    Call MD for:  persistant nausea and vomiting   Complete by: As directed    Call MD for:  persistant nausea and vomiting   Complete by: As directed    Call MD for:  redness, tenderness, or signs of infection (pain, swelling, redness, odor or green/yellow discharge around incision site)   Complete by: As directed    Call MD for:  severe uncontrolled pain   Complete by: As directed    Call MD for:  severe uncontrolled pain   Complete by: As directed    Call MD for:  temperature >100.4   Complete by: As directed    Call MD for:  temperature >100.4   Complete by: As directed    Diet - low sodium heart healthy   Complete by: As directed    Diet - low sodium heart healthy   Complete by: As directed    Discharge instructions   Complete by: As directed    You were seen for COVID 19 virus infection.  You improved with steroids and remdesivir.  You will need to continue to quarantine until January 4th.  You can discontinue isolation on that date if your symptoms have resolved without the use of medications for 24 hours.  You had an episode of atrial fibrillation while in the hospital, likely related to your pneumonia from COVID.  You should follow up with William Beard cardiologist as an outpatient.  Sometimes patients with atrial fibrillation are anticoagulated to decrease the risk of stroke.  Please follow up with William Beard cardiologist to discuss this.  Follow an outpatient chest x ray.    You have prediabetes, follow this up with your PCP outpatient.   During your drive home, take frequent stops to stretch your legs and walk to help prevent blood clots.  Return for new, recurrent, or worsening symptoms.  Please ask your PCP to request records from this hospitalization so they know what was done and what the next steps will be.   Increase activity slowly   Complete by: As directed    Increase  activity slowly   Complete by: As directed      Allergies as of 07/06/2019   No Known Allergies     Medication List    TAKE these medications   albuterol 108 (90 Base) MCG/ACT inhaler Commonly known as: VENTOLIN HFA Inhale 2 puffs into the lungs every 6 (six) hours as needed for wheezing or shortness of breath.   guaiFENesin-dextromethorphan 100-10 MG/5ML syrup Commonly known as: ROBITUSSIN DM Take 10 mLs by mouth every 4 (four) hours as needed for cough.   sodium chloride 0.65 % Soln nasal spray Commonly known as: OCEAN Place 1 spray into both nostrils as needed for congestion.      No Known Allergies    The results of significant diagnostics from this hospitalization (including imaging, microbiology, ancillary and laboratory) are listed below for reference.    Significant Diagnostic Studies: DG Chest 1 View  Result Date: 06/30/2019 CLINICAL DATA:  Shortness  of breath. EXAM: CHEST  1 VIEW COMPARISON:  06/18/2019 FINDINGS: Lungs are hypoinflated with mild patchy airspace opacification bilaterally left worse than right without significant change. Possible small amount left pleural fluid. Mild stable cardiomegaly. Remainder of the exam is unchanged. IMPRESSION: Hypoinflation with stable bilateral patchy airspace process likely multifocal infection. Mild stable cardiomegaly. Electronically Signed   By: Elberta Fortis M.D.   On: 06/30/2019 04:07   CT ANGIO CHEST PE W OR WO CONTRAST  Result Date: 06/19/2019 CLINICAL DATA:  Shortness of breath. Weakness. EXAM: CT ANGIOGRAPHY CHEST WITH CONTRAST TECHNIQUE: Multidetector CT imaging of the chest was performed using the standard protocol during bolus administration of intravenous contrast. Multiplanar CT image reconstructions and MIPs were obtained to evaluate the vascular anatomy. CONTRAST:  73mL OMNIPAQUE IOHEXOL 350 MG/ML SOLN COMPARISON:  Radiograph yesterday. FINDINGS: Cardiovascular: There are no filling defects within the pulmonary  arteries to suggest pulmonary embolus. Mild aortic atherosclerosis. No aortic dissection. Mild multi chamber cardiomegaly. No pericardial effusion. Mediastinum/Nodes: No enlarged mediastinal or hilar lymph nodes. Small upper paratracheal nodes are subcentimeter. Decompressed esophagus. No thyroid nodule. Lungs/Pleura: Bilateral heterogeneous primarily ground-glass opacities throughout both lungs, peripheral and slight basilar predominant distribution. No pleural fluid. Trachea and mainstem bronchi are patent. Upper Abdomen: No acute findings. Musculoskeletal: There are no acute or suspicious osseous abnormalities. Mild scoliosis and degenerative change in the spine. Review of the MIP images confirms the above findings. IMPRESSION: 1. No pulmonary embolus. 2. Bilateral heterogeneous primarily ground-glass opacities throughout both lungs, consistent with COVID-19 pneumonia. 3. Mild cardiomegaly. Aortic Atherosclerosis (ICD10-I70.0). Electronically Signed   By: Narda Rutherford M.D.   On: 06/19/2019 05:08   US Venous Img Lower Unilateral Right  Result Date: 06/18/2019 CLINICAL DATA:  Pain and swelling EXAM: RIGHT LOWER EXTREMITY VENOUS DOPPLER ULTRASOUND TECHNIQUE: Gray-scale sonography with graded compression, as well as color Doppler and duplex ultrasound were performed to evaluate the lower extremity deep venous systems from the level of the common femoral vein and including the common femoral, femoral, profunda femoral, popliteal and calf veins including the posterior tibial, peroneal and gastrocnemius veins when visible. The superficial great saphenous vein was also interrogated. Spectral Doppler was utilized to evaluate flow at rest and with distal augmentation maneuvers in the common femoral, femoral and popliteal veins. COMPARISON:  None. FINDINGS: Contralateral Common Femoral Vein: Respiratory phasicity is normal and symmetric with the symptomatic side. No evidence of thrombus. Normal compressibility.  Common Femoral Vein: No evidence of thrombus. Normal compressibility, respiratory phasicity and response to augmentation. Saphenofemoral Junction: No evidence of thrombus. Normal compressibility and flow on color Doppler imaging. Profunda Femoral Vein: No evidence of thrombus. Normal compressibility and flow on color Doppler imaging. Femoral Vein: No evidence of thrombus. Normal compressibility, respiratory phasicity and response to augmentation. Popliteal Vein: No evidence of thrombus. Normal compressibility, respiratory phasicity and response to augmentation. Calf Veins: No evidence of thrombus. Normal compressibility and flow on color Doppler imaging. Superficial Great Saphenous Vein: No evidence of thrombus. Normal compressibility. Venous Reflux:  None. Other Findings:  None. IMPRESSION: No evidence of deep venous thrombosis. Electronically Signed   By: Katherine Mantle M.D.   On: 06/18/2019 23:10   DG Chest Port 1 View  Result Date: 06/18/2019 CLINICAL DATA:  Shortness of breath EXAM: PORTABLE CHEST 1 VIEW COMPARISON:  None. FINDINGS: Low lung volumes with bibasilar atelectasis. Cardiomegaly. No overt edema or effusions. No acute bony abnormality. IMPRESSION: Low lung volumes, bibasilar atelectasis. Electronically Signed   By: Charlett Nose M.D.  On: 06/18/2019 21:37    Microbiology: No results found for this or any previous visit (from the past 240 hour(s)).   Labs: Basic Metabolic Panel: Recent Labs  Lab 07/02/19 0650 07/03/19 0150 07/04/19 1125 07/05/19 0607 07/06/19 1010  NA 135 133* 133* 133* 133*  K 3.9 3.5 3.4* 4.0 3.6  CL 93* 93* 94* 94* 94*  CO2 31 29 27 26 25   GLUCOSE 116* 109* 145* 101* 194*  BUN 40* 39* 29* 27* 25*  CREATININE 0.99 1.03 0.77 1.02 0.96  CALCIUM 8.7* 8.3* 8.3* 8.3* 8.6*  MG  --   --   --  2.4 2.3  PHOS  --   --   --  3.6 3.7   Liver Function Tests: Recent Labs  Lab 07/02/19 0650 07/03/19 0150 07/04/19 1125 07/05/19 0607 07/06/19 1010  AST 25 27  24 22 29   ALT 42 41 36 33 37  ALKPHOS 66 66 64 67 82  BILITOT 1.5* 1.9* 1.6* 1.9* 1.1  PROT 6.4* 6.4* 6.2* 6.3* 6.8  ALBUMIN 3.1* 3.3* 3.1* 3.3* 3.2*   No results for input(s): LIPASE, AMYLASE in the last 168 hours. No results for input(s): AMMONIA in the last 168 hours. CBC: Recent Labs  Lab 07/01/19 0046 07/04/19 1125 07/05/19 0607 07/06/19 1010  WBC 15.4* 12.0* 11.6* 10.4  NEUTROABS  --   --  8.7* 8.1*  HGB 15.5 14.5 13.9 14.5  HCT 46.3 43.1 41.2 42.6  MCV 93.0 93.3 93.8 92.8  PLT 450* 298 292 271   Cardiac Enzymes: No results for input(s): CKTOTAL, CKMB, CKMBINDEX, TROPONINI in the last 168 hours. BNP: BNP (last 3 results) Recent Labs    06/20/19 0440 06/21/19 0510 06/22/19 0400  BNP 47.8 39.3 23.3    ProBNP (last 3 results) No results for input(s): PROBNP in the last 8760 hours.  CBG: Recent Labs  Lab 07/04/19 2034 07/05/19 0821 07/05/19 1209 07/05/19 1711 07/05/19 2047  GLUCAP 140* 104* 131* 88 116*       Signed:  07/07/19 MD.  [facility] 07/07/2019, 7:17 PM

## 2019-07-06 NOTE — Progress Notes (Signed)
   07/06/19 1000  Family/Significant Other Communication  Family/Significant Other Update Called;Updated  Called and updated. All questions asked and all questions answered.

## 2019-07-06 NOTE — Discharge Instructions (Signed)
You will quarantine until January 4th.  You can discontinue quarantine if your symptoms have improved and you have not used any medications for 24 hours after this date.  Call your PCP or the health department with questions.     Person Under Monitoring Name: William Beard  Location: [place] [address]   Infection Prevention Recommendations for Individuals Confirmed to have, or Being Evaluated for, 2019 Novel Coronavirus (COVID-19) Infection Who Receive Care at Home  Individuals who are confirmed to have, or are being evaluated for, COVID-19 should follow the prevention steps below until William Beard healthcare provider or local or state health department says they can return to normal activities.  Stay home except to get medical care You should restrict activities outside your home, except for getting medical care. Do not go to work, school, or public areas, and do not use public transportation or taxis.  Call ahead before visiting your doctor Before your medical appointment, call the healthcare provider and tell them that you have, or are being evaluated for, COVID-19 infection. This will help the healthcare provider's office take steps to keep other people from getting infected. Ask your healthcare provider to call the local or state health department.  Monitor your symptoms Seek prompt medical attention if your illness is worsening (e.g., difficulty breathing). Before going to your medical appointment, call the healthcare provider and tell them that you have, or are being evaluated for, COVID-19 infection. Ask your healthcare provider to call the local or state health department.  Wear William Beard facemask You should wear William Beard facemask that covers your nose and mouth when you are in the same room with other people and when you visit William Beard healthcare provider. People who live with or visit you should also wear William Beard facemask while they are in the same room with  you.  Separate yourself from other people in your home As much as possible, you should stay in William Beard different room from other people in your home. Also, you should use William Beard separate bathroom, if available.  Avoid sharing household items You should not share dishes, drinking glasses, cups, eating utensils, towels, bedding, or other items with other people in your home. After using these items, you should wash them thoroughly with soap and water.  Cover your coughs and sneezes Cover your mouth and nose with William Beard tissue when you cough or sneeze, or you can cough or sneeze into your sleeve. Throw used tissues in William Beard lined trash can, and immediately wash your hands with soap and water for at least 20 seconds or use an alcohol-based hand rub.  Wash your [name] your hands often and thoroughly with soap and water for at least 20 seconds. You can use an alcohol-based hand sanitizer if soap and water are not available and if your hands are not visibly dirty. Avoid touching your eyes, nose, and mouth with unwashed hands.   Prevention Steps for Caregivers and Household Members of Individuals Confirmed to have, or Being Evaluated for, COVID-19 Infection Being Cared for in the Home  If you live with, or provide care at home for, William Beard person confirmed to have, or being evaluated for, COVID-19 infection please follow these guidelines to prevent infection:  Follow healthcare provider's instructions Make sure that you understand and can help the patient follow any healthcare provider instructions for all care.  Provide for the patient's basic needs You should help the patient with basic needs in the home and provide support for getting groceries,  prescriptions, and other personal needs.  Monitor the patient's symptoms If they are getting sicker, call his or her medical provider and tell them that the patient has, or is being evaluated for, COVID-19 infection. This will help the healthcare provider's office  take steps to keep other people from getting infected. Ask the healthcare provider to call the local or state health department.  Limit the number of people who have contact with the patient  If possible, have only one caregiver for the patient.  Other household members should stay in another home or place of residence. If this is not possible, they should stay  in another room, or be separated from the patient as much as possible. Use William Beard separate bathroom, if available.  Restrict visitors who do not have an essential need to be in the home.  Keep older adults, very young children, and other sick people away from the patient Keep older adults, very young children, and those who have compromised immune systems or chronic health conditions away from the patient. This includes people with chronic heart, lung, or kidney conditions, diabetes, and cancer.  Ensure good ventilation Make sure that shared spaces in the home have good air flow, such as from an air conditioner or an opened window, weather permitting.  Wash your hands often  Wash your hands often and thoroughly with soap and water for at least 20 seconds. You can use an alcohol based hand sanitizer if soap and water are not available and if your hands are not visibly dirty.  Avoid touching your eyes, nose, and mouth with unwashed hands.  Use disposable paper towels to dry your hands. If not available, use dedicated cloth towels and replace them when they become wet.  Wear William Beard facemask and gloves  Wear William Beard disposable facemask at all times in the room and gloves when you touch or have contact with the patient's blood, body fluids, and/or secretions or excretions, such as sweat, saliva, sputum, nasal mucus, vomit, urine, or feces.  Ensure the mask fits over your nose and mouth tightly, and do not touch it during use.  Throw out disposable facemasks and gloves after using them. Do not reuse.  Wash your hands immediately after removing  your facemask and gloves.  If your personal clothing becomes contaminated, carefully remove clothing and launder. Wash your hands after handling contaminated clothing.  Place all used disposable facemasks, gloves, and other waste in Kairee Kozma lined container before disposing them with other household waste.  Remove gloves and wash your hands immediately after handling these items.  Do not share dishes, glasses, or other household items with the patient  Avoid sharing household items. You should not share dishes, drinking glasses, cups, eating utensils, towels, bedding, or other items with Locke Barrell patient who is confirmed to have, or being evaluated for, COVID-19 infection.  After the person uses these items, you should wash them thoroughly with soap and water.  Wash laundry thoroughly  Immediately remove and wash clothes or bedding that have blood, body fluids, and/or secretions or excretions, such as sweat, saliva, sputum, nasal mucus, vomit, urine, or feces, on them.  Wear gloves when handling laundry from the patient.  Read and follow directions on labels of laundry or clothing items and detergent. In general, wash and dry with the warmest temperatures recommended on the label.  Clean all areas the individual has used often  Clean all touchable surfaces, such as counters, tabletops, doorknobs, bathroom fixtures, toilets, phones, keyboards, tablets, and bedside tables, every  day. Also, clean any surfaces that may have blood, body fluids, and/or secretions or excretions on them.  Wear gloves when cleaning surfaces the patient has come in contact with.  Use Cordai Rodrigue diluted bleach solution (e.g., dilute bleach with 1 part bleach and 10 parts water) or Kevan Prouty household disinfectant with Jeselle Hiser label that says EPA-registered for coronaviruses. To make Takeyah Wieman bleach solution at home, add 1 tablespoon of bleach to 1 quart (4 cups) of water. For Kohle Winner larger supply, add  cup of bleach to 1 gallon (16 cups) of water.  Read labels  of cleaning products and follow recommendations provided on product labels. Labels contain instructions for safe and effective use of the cleaning product including precautions you should take when applying the product, such as wearing gloves or eye protection and making sure you have good ventilation during use of the product.  Remove gloves and wash hands immediately after cleaning.  Monitor yourself for signs and symptoms of illness Caregivers and household members are considered close contacts, should monitor their health, and will be asked to limit movement outside of the home to the extent possible. Follow the monitoring steps for close contacts listed on the symptom monitoring form.   ? If you have additional questions, contact your local health department or call the epidemiologist on call at [phone] (available 24/7). ? This guidance is subject to change. For the most up-to-date guidance from [facility], please refer to their website: [employer]hu

## 2019-07-09 LAB — GLUCOSE, CAPILLARY: Glucose-Capillary: 99 mg/dL (ref 70–99)

## 2019-08-07 ENCOUNTER — Other Ambulatory Visit: Payer: Self-pay

## 2021-06-03 IMAGING — US US EXTREM LOW VENOUS*R*
1 series · 13 of 24 positions shown · non-contrast
Comparison: None.

CLINICAL DATA: Pain and swelling



[Series 1: us extrem low venous*right* · 0.08mm/px · 13 of 33 slices shown]
[im 1/33]
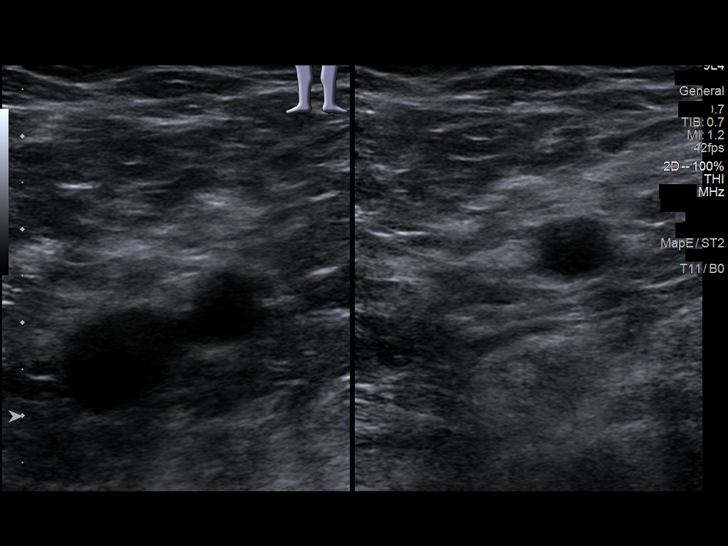
[im 3/33]
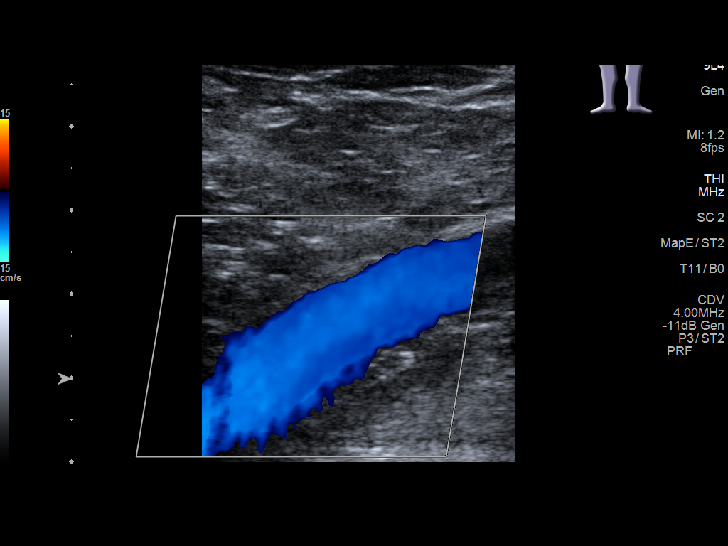
[im 6/33]
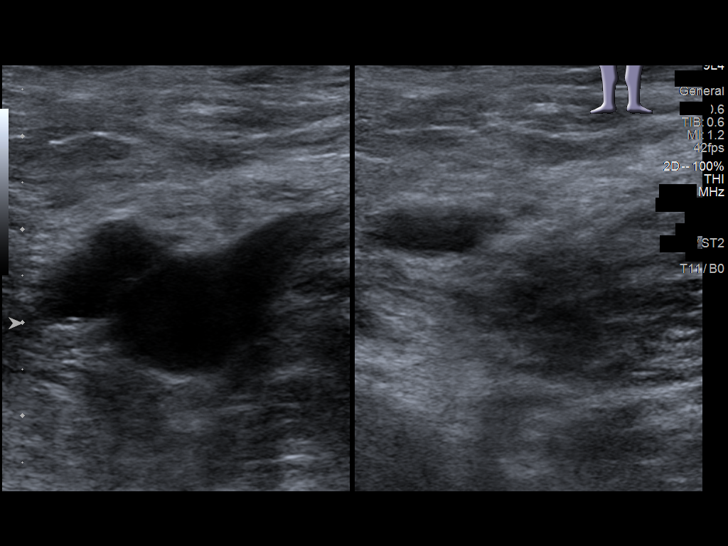
[im 9/33]
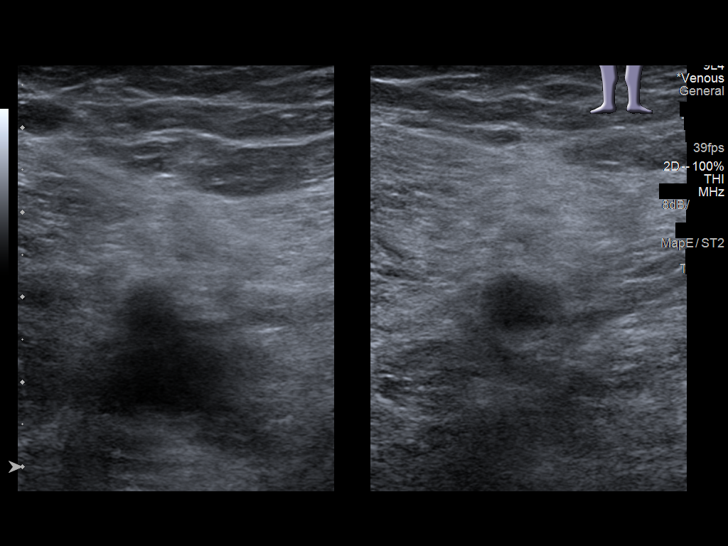
[im 12/33]
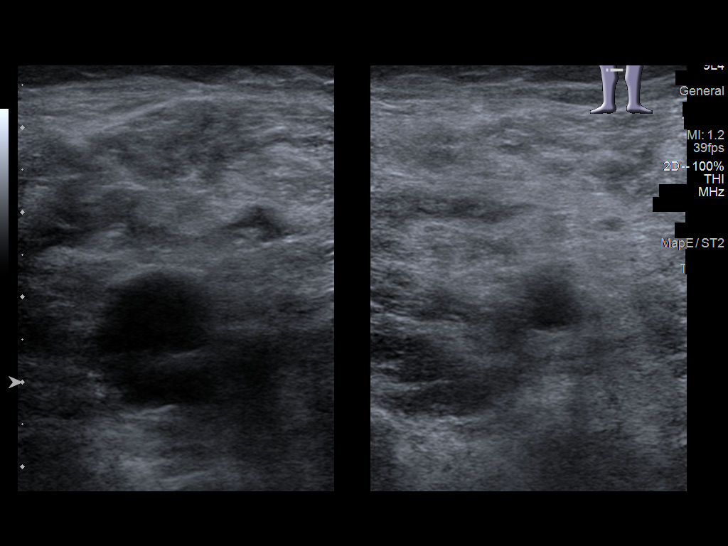
[im 14/33]
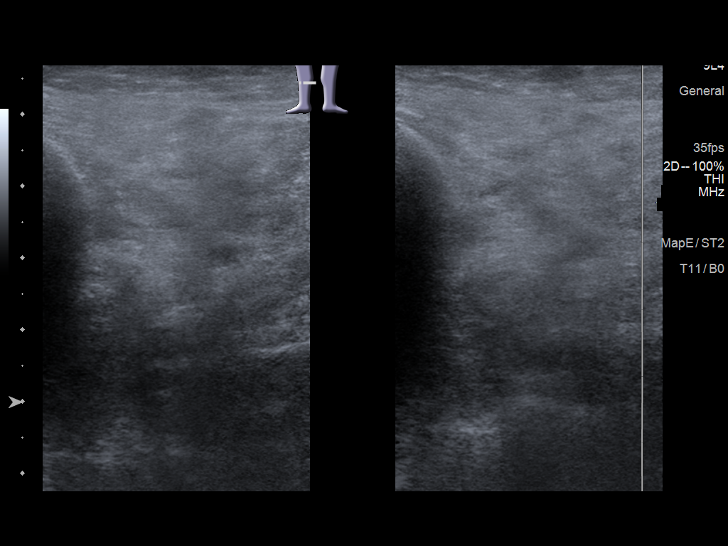
[im 17/33]
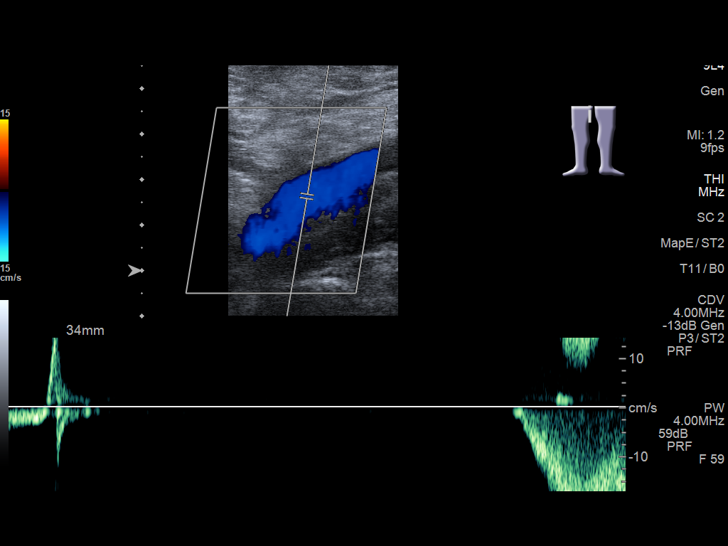
[im 19/33]
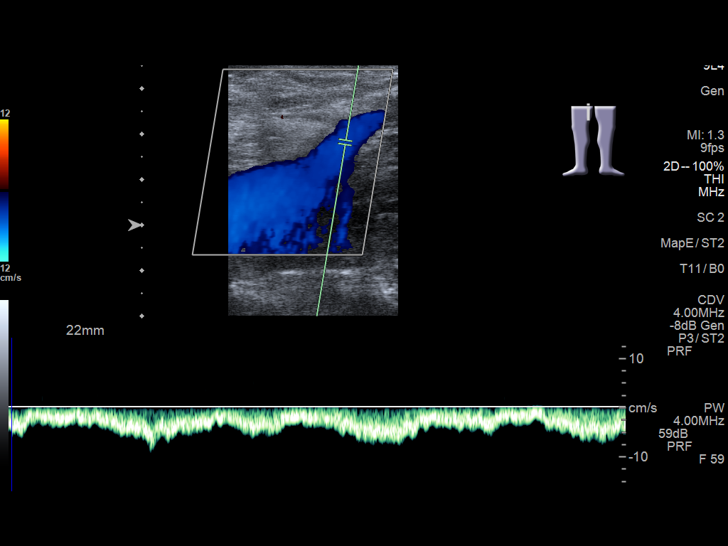
[im 21/33]
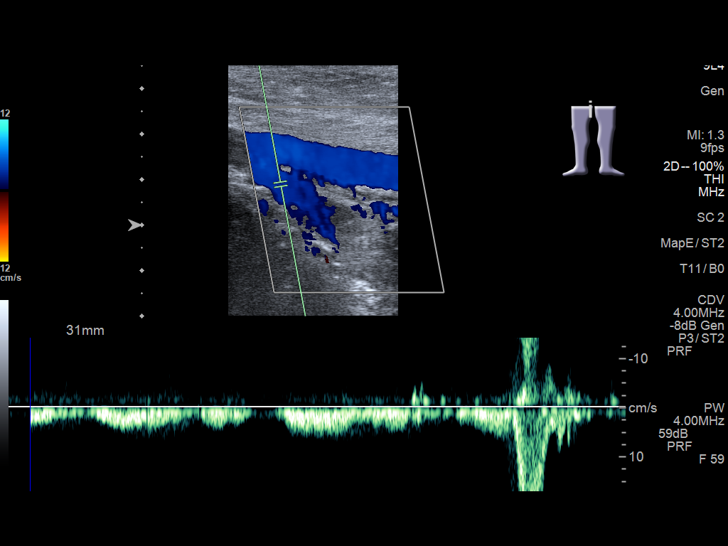
[im 24/33]
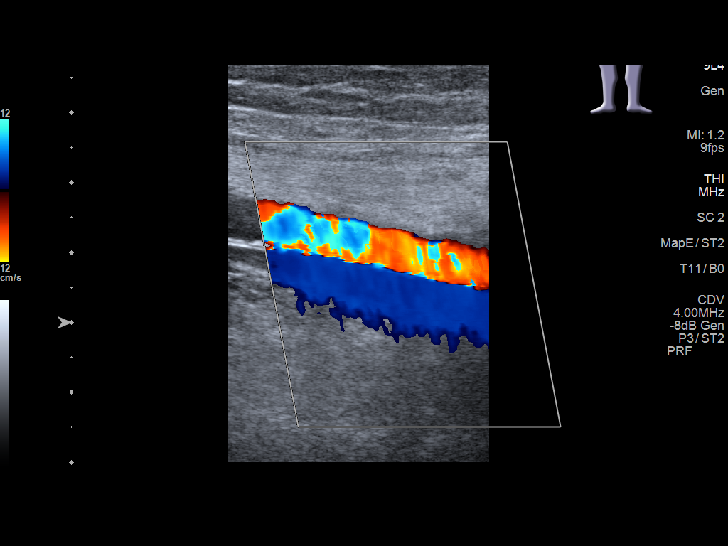
[im 27/33]
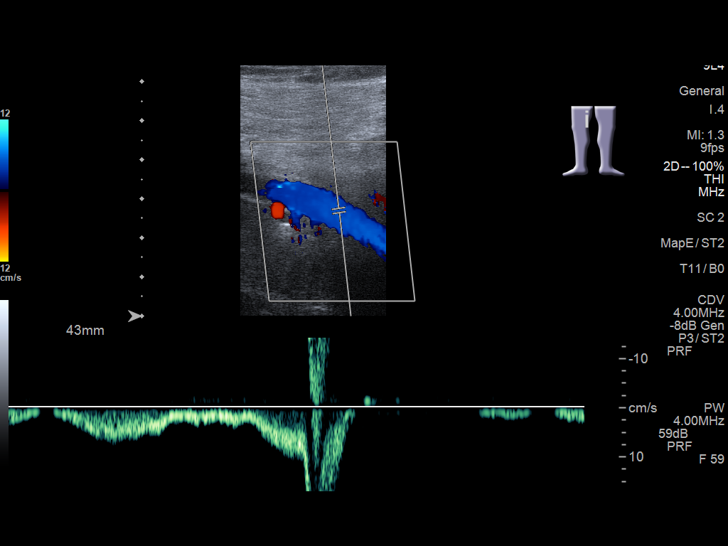
[im 30/33]
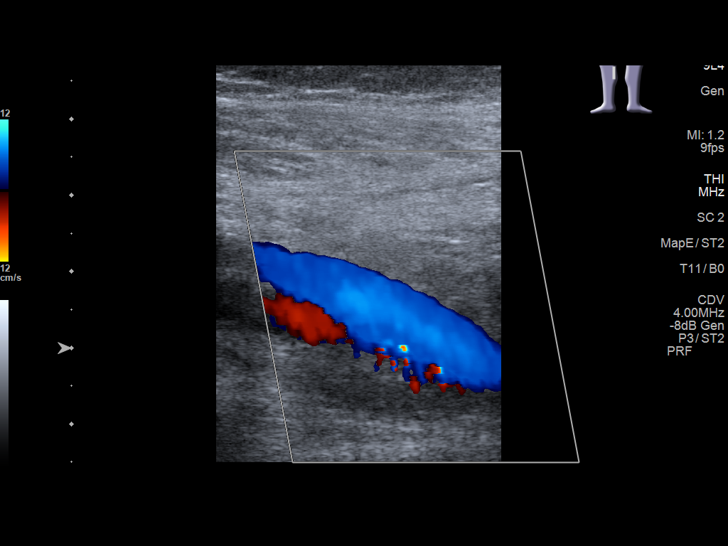
[im 33/33]
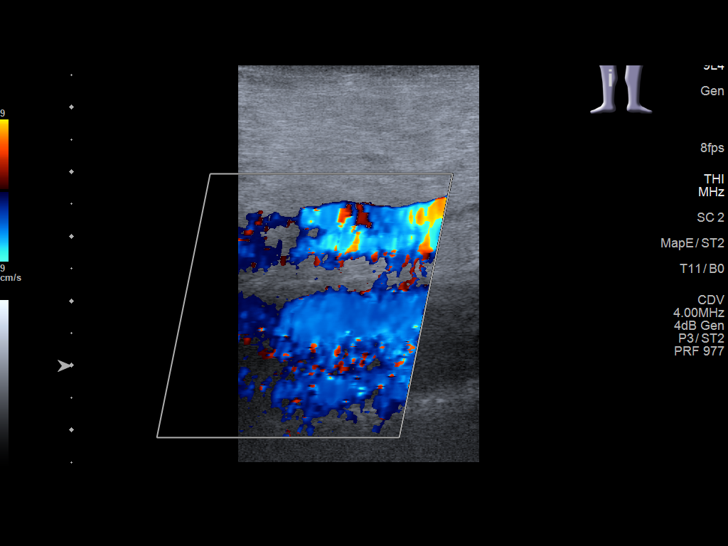

[13 of 24 positions shown; findings below may reference images not displayed]

FINDINGS: Contralateral Common Femoral Vein: Respiratory phasicity is normal
and symmetric with the symptomatic side. No evidence of thrombus.
Normal compressibility.

Common Femoral Vein: No evidence of thrombus. Normal
compressibility, respiratory phasicity and response to augmentation.

Saphenofemoral Junction: No evidence of thrombus. Normal
compressibility and flow on color Doppler imaging.

Profunda Femoral Vein: No evidence of thrombus. Normal
compressibility and flow on color Doppler imaging.

Femoral Vein: No evidence of thrombus. Normal compressibility,
respiratory phasicity and response to augmentation.

Popliteal Vein: No evidence of thrombus. Normal compressibility,
respiratory phasicity and response to augmentation.

Calf Veins: No evidence of thrombus. Normal compressibility and flow
on color Doppler imaging.

Superficial Great Saphenous Vein: No evidence of thrombus. Normal
compressibility.

Venous Reflux:  None.

Other Findings:  None.
IMPRESSION: No evidence of deep venous thrombosis.

## 2021-06-03 IMAGING — DX DG CHEST 1V PORT
1 series · 1 of 1 positions shown · non-contrast
Comparison: None.

CLINICAL DATA: Shortness of breath

EXAM:
PORTABLE CHEST 1 VIEW

[chest ap]
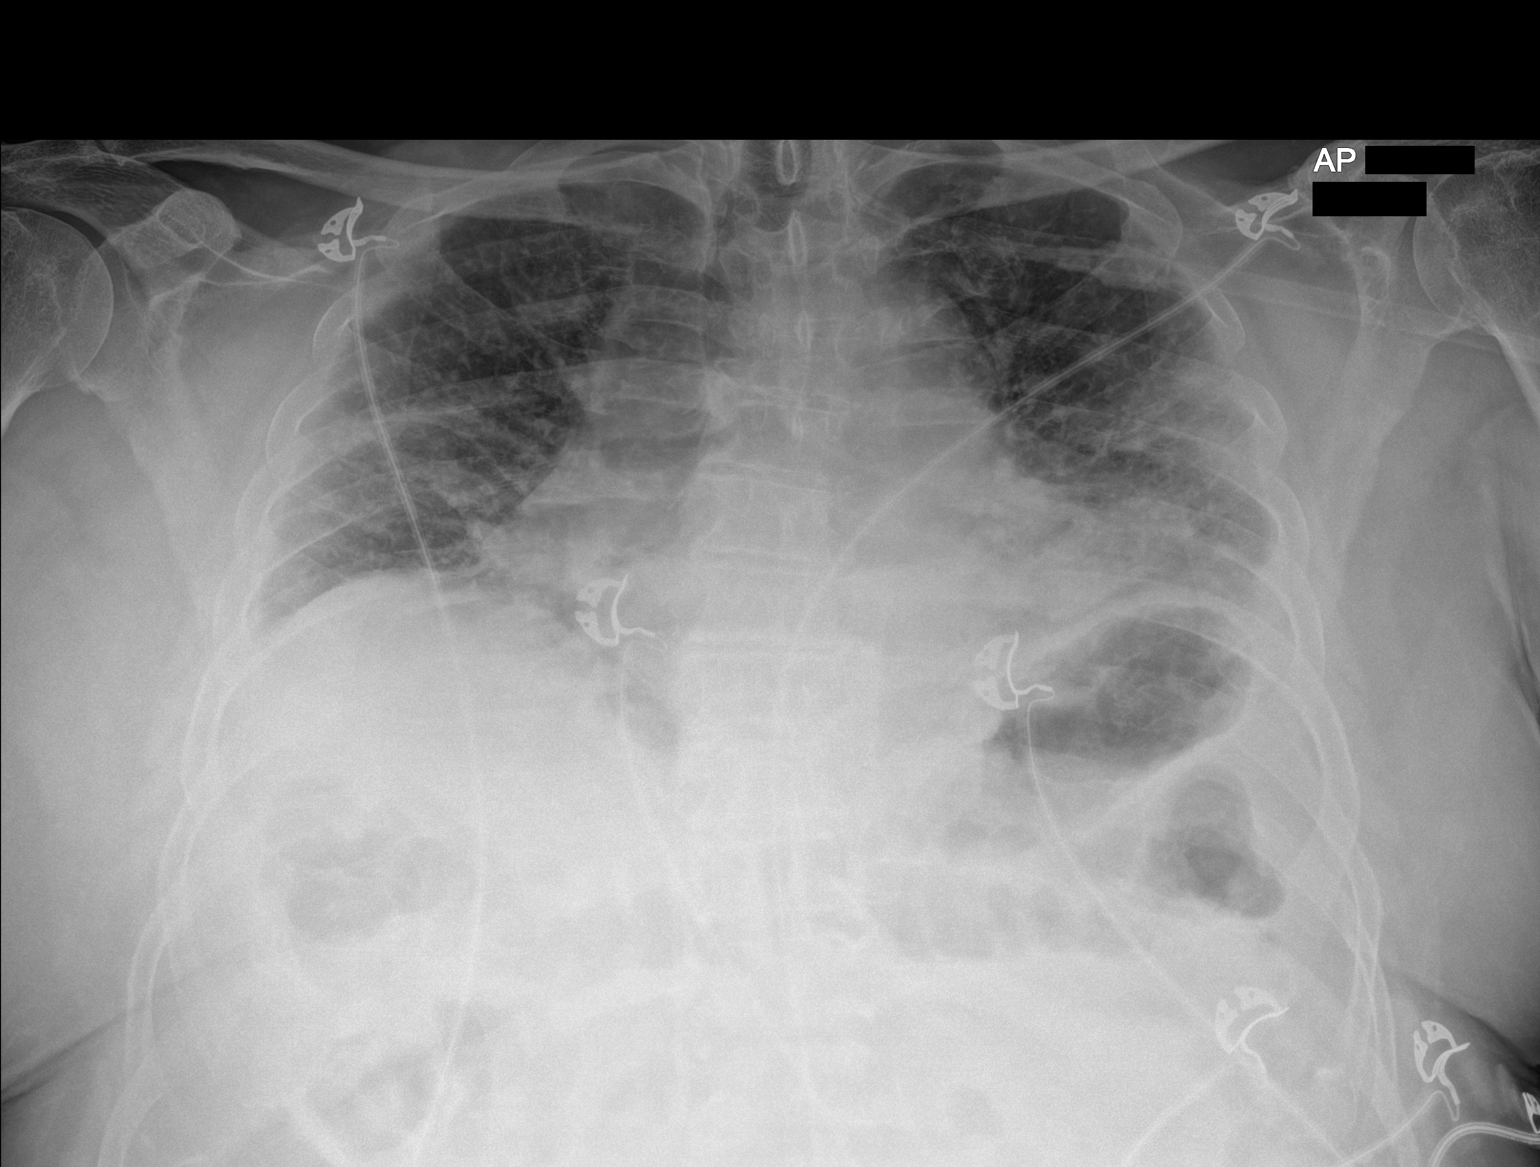

[1 of 1 positions shown; findings below may reference images not displayed]

FINDINGS: Low lung volumes with bibasilar atelectasis. Cardiomegaly. No overt
edema or effusions. No acute bony abnormality.
IMPRESSION: Low lung volumes, bibasilar atelectasis.

## 2021-06-04 IMAGING — CT CT ANGIO CHEST
2 of 6 series · 18 of 46 positions shown · IV contrast (omnipaque)
Comparison: Radiograph yesterday.

CLINICAL DATA: Shortness of breath. Weakness.

EXAM:
CT ANGIOGRAPHY CHEST WITH CONTRAST
TECHNIQUE: Multidetector CT imaging of the chest was performed using the
standard protocol during bolus administration of intravenous
contrast. Multiplanar CT image reconstructions and MIPs were
obtained to evaluate the vascular anatomy.
CONTRAST:  75mL OMNIPAQUE IOHEXOL 350 MG/ML SOLN

[Series 5: thins · axial · 0.72mm/px · z∈[-672,-440]mm · 15 of 255 slices shown]
[im 12/255  lung]
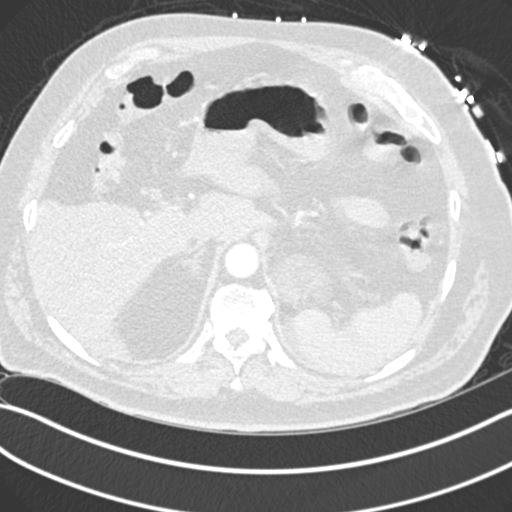
[im 34/255  soft-tissue]
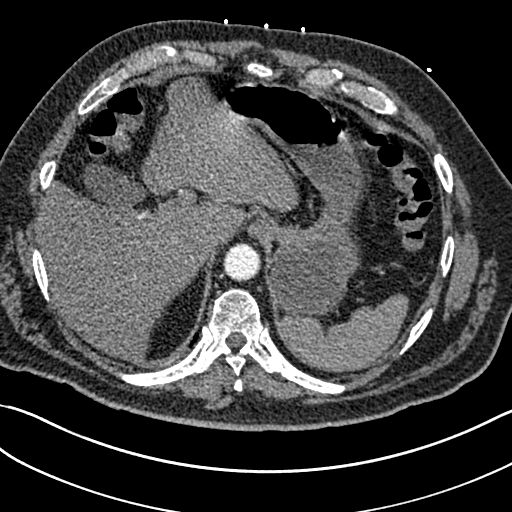
[im 45/255  lung]
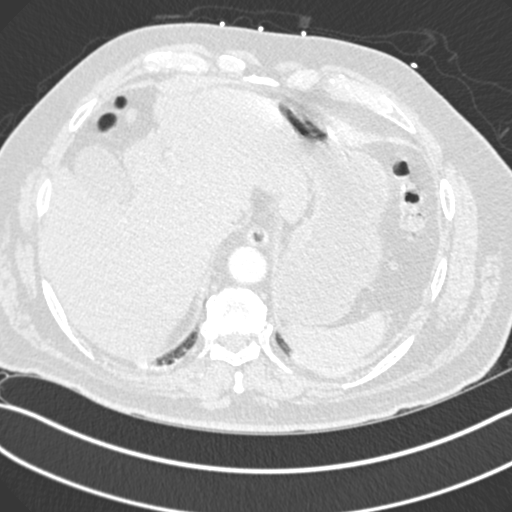
[im 67/255  soft-tissue]
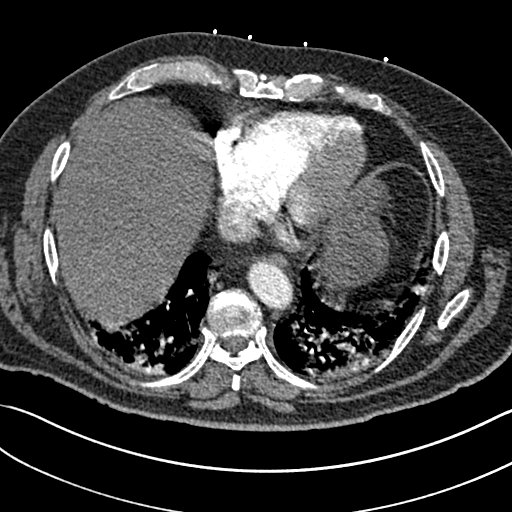
[im 78/255  lung]
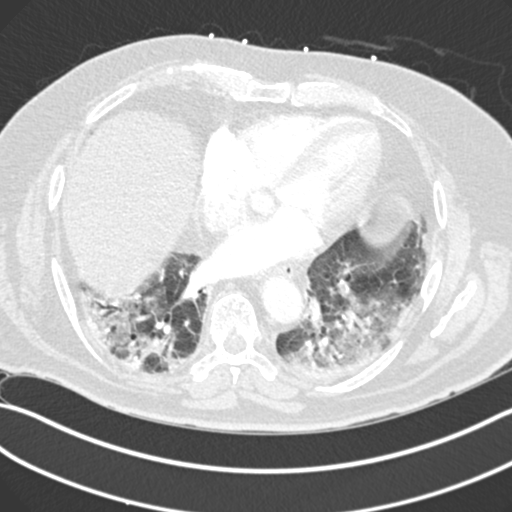
[im 100/255  soft-tissue]
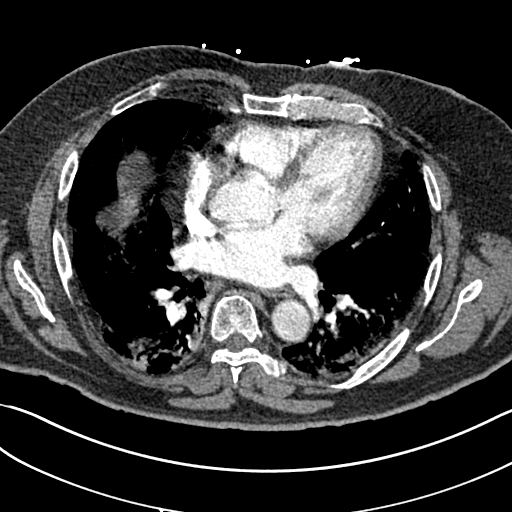
[im 111/255  lung]
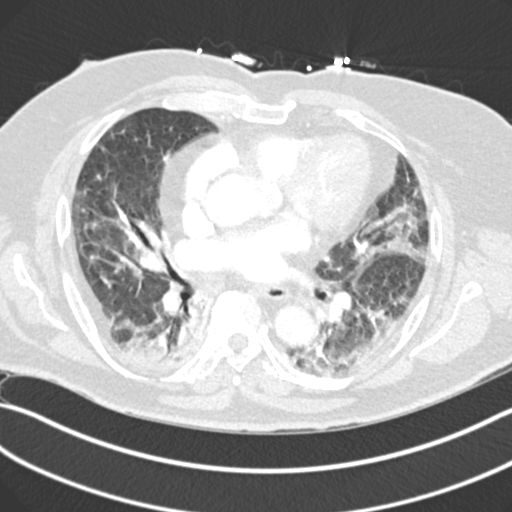
[im 133/255  soft-tissue]
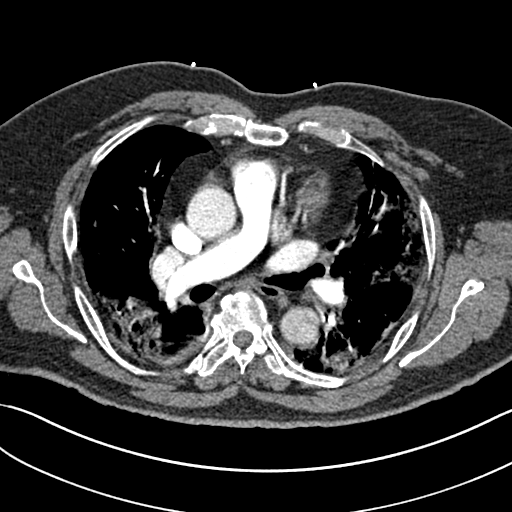
[im 144/255  lung]
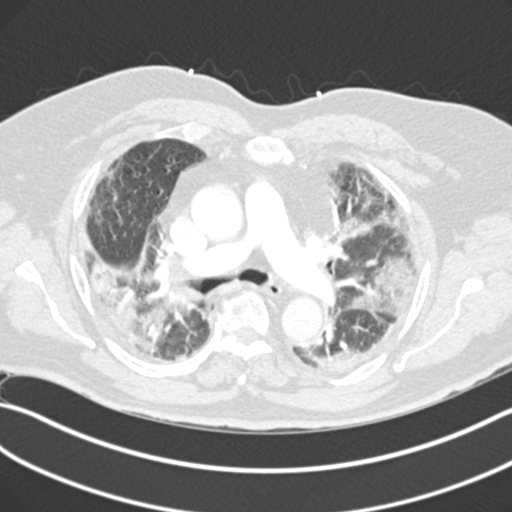
[im 155/255  soft-tissue]
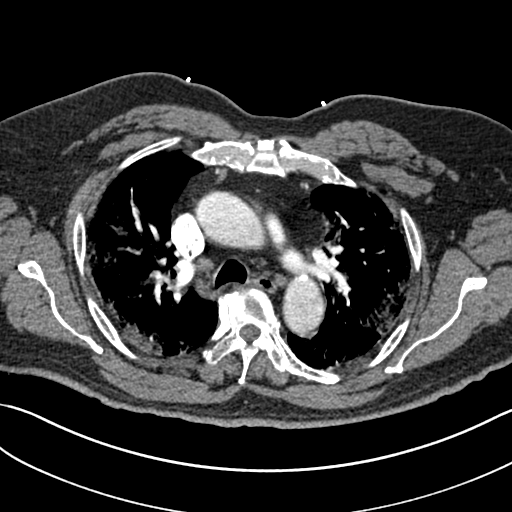
[im 177/255  lung]
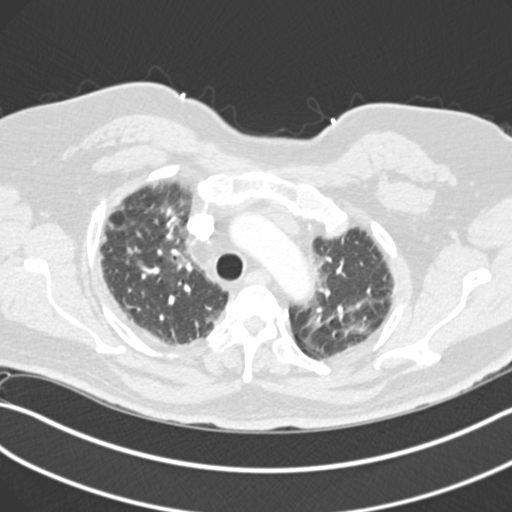
[im 188/255  soft-tissue]
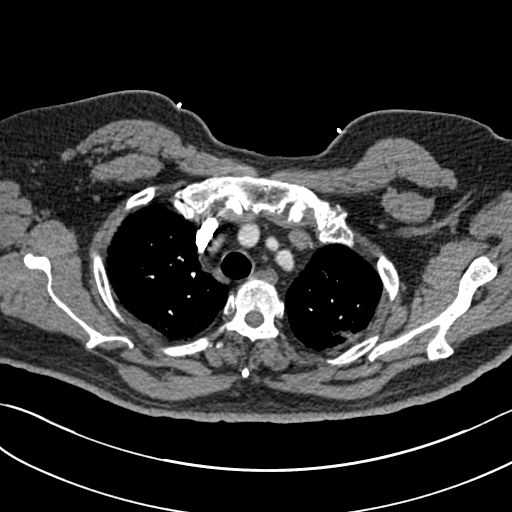
[im 210/255  lung]
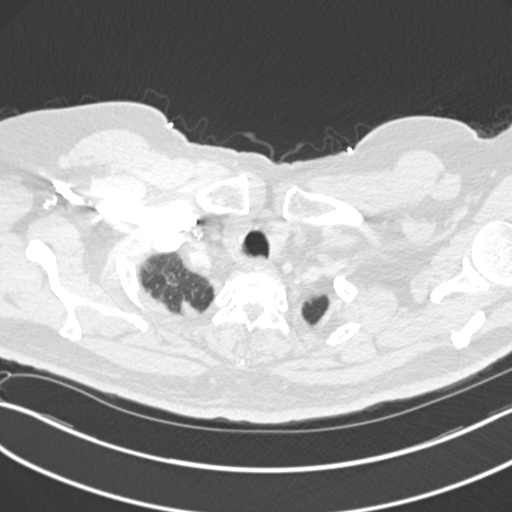
[im 221/255  soft-tissue]
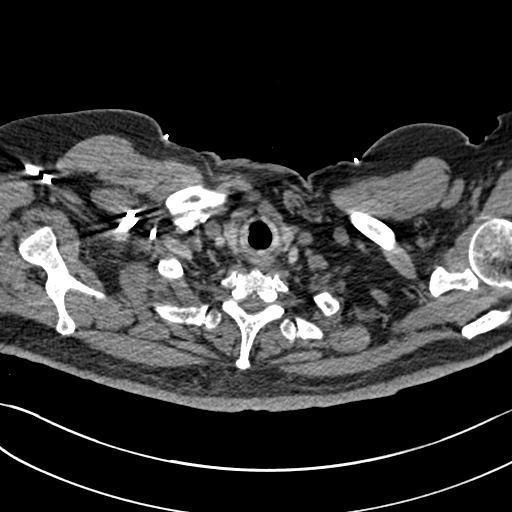
[im 243/255  lung]
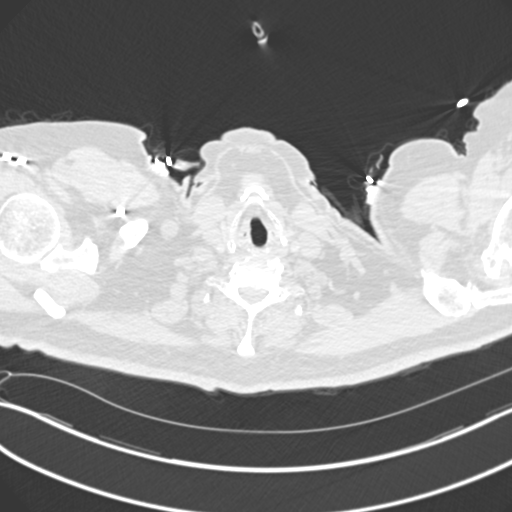

[Series 7: coronal mpr · coronal · 0.50mm/px · 3 of 106 slices shown]
[im 27/106  soft-tissue]
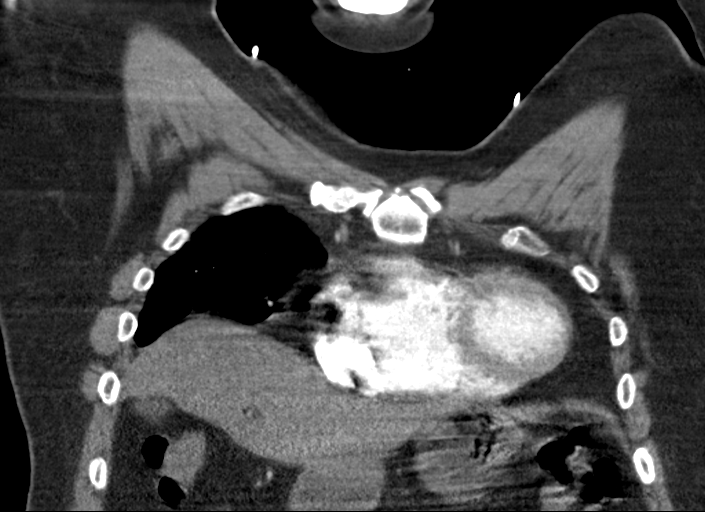
[im 53/106  soft-tissue]
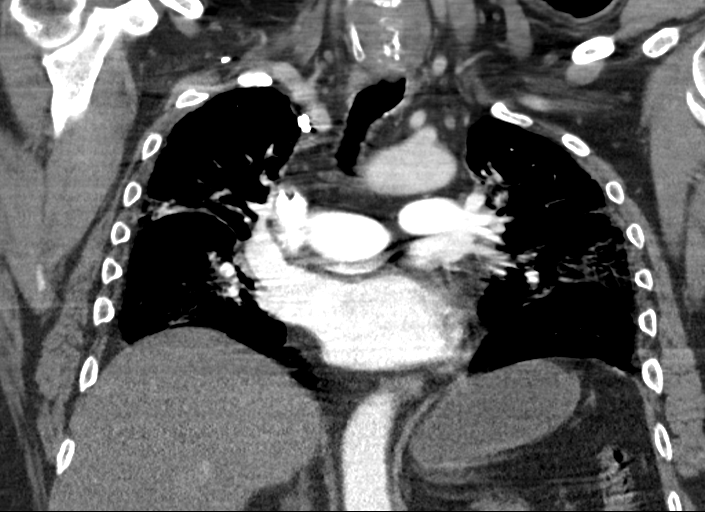
[im 79/106  soft-tissue]
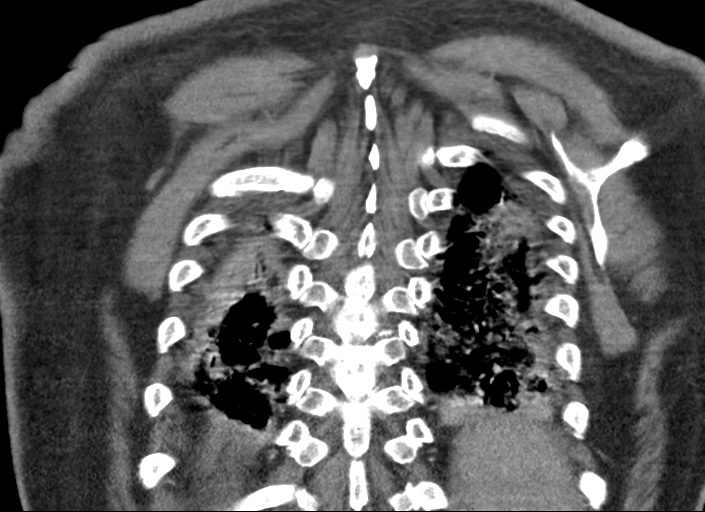

[18 of 46 positions shown; findings below may reference images not displayed]

FINDINGS: Cardiovascular: There are no filling defects within the pulmonary
arteries to suggest pulmonary embolus. Mild aortic atherosclerosis.
No aortic dissection. Mild multi chamber cardiomegaly. No
pericardial effusion.

Mediastinum/Nodes: No enlarged mediastinal or hilar lymph nodes.
Small upper paratracheal nodes are subcentimeter. Decompressed
esophagus. No thyroid nodule.

Lungs/Pleura: Bilateral heterogeneous primarily ground-glass
opacities throughout both lungs, peripheral and slight basilar
predominant distribution. No pleural fluid. Trachea and mainstem
bronchi are patent.

Upper Abdomen: No acute findings.

Musculoskeletal: There are no acute or suspicious osseous
abnormalities. Mild scoliosis and degenerative change in the spine.

Review of the MIP images confirms the above findings.
IMPRESSION: 1. No pulmonary embolus.
2. Bilateral heterogeneous primarily ground-glass opacities
throughout both lungs, consistent with [id number] pneumonia.
3. Mild cardiomegaly.

Aortic Atherosclerosis ([id number]).
